# Patient Record
Sex: Female | Born: 1955 | Race: White | Hispanic: No | Smoking: Former smoker
Health system: Southern US, Community
[De-identification: ages and names within clinical notes are randomized; demographics above are authoritative.]

## PROBLEM LIST (undated history)

## (undated) DIAGNOSIS — M1990 Unspecified osteoarthritis, unspecified site: Secondary | ICD-10-CM

## (undated) DIAGNOSIS — E669 Obesity, unspecified: Secondary | ICD-10-CM

## (undated) DIAGNOSIS — E785 Hyperlipidemia, unspecified: Secondary | ICD-10-CM

## (undated) DIAGNOSIS — F32A Depression, unspecified: Secondary | ICD-10-CM

## (undated) DIAGNOSIS — E88819 Insulin resistance, unspecified: Secondary | ICD-10-CM

## (undated) DIAGNOSIS — F909 Attention-deficit hyperactivity disorder, unspecified type: Secondary | ICD-10-CM

## (undated) HISTORY — DX: Obesity, unspecified: E66.9

## (undated) HISTORY — PX: KNEE ARTHROSCOPY: SUR90

## (undated) HISTORY — DX: Depression, unspecified: F32.A

## (undated) HISTORY — PX: COLONOSCOPY: SHX174

## (undated) HISTORY — DX: Attention-deficit hyperactivity disorder, unspecified type: F90.9

## (undated) HISTORY — DX: Insulin resistance, unspecified: E88.819

## (undated) HISTORY — DX: Unspecified osteoarthritis, unspecified site: M19.90

## (undated) HISTORY — DX: Hyperlipidemia, unspecified: E78.5

---

## 1997-11-18 ENCOUNTER — Other Ambulatory Visit: Admission: RE | Admit: 1997-11-18 | Discharge: 1997-11-18 | Payer: Self-pay | Admitting: Obstetrics & Gynecology

## 1998-07-01 ENCOUNTER — Ambulatory Visit (HOSPITAL_COMMUNITY): Admission: RE | Admit: 1998-07-01 | Discharge: 1998-07-01 | Payer: Self-pay | Admitting: Obstetrics & Gynecology

## 1999-02-23 ENCOUNTER — Other Ambulatory Visit: Admission: RE | Admit: 1999-02-23 | Discharge: 1999-02-23 | Payer: Self-pay | Admitting: Obstetrics & Gynecology

## 1999-04-04 ENCOUNTER — Emergency Department (HOSPITAL_COMMUNITY): Admission: EM | Admit: 1999-04-04 | Discharge: 1999-04-04 | Payer: Self-pay | Admitting: Emergency Medicine

## 1999-05-19 ENCOUNTER — Ambulatory Visit (HOSPITAL_COMMUNITY): Admission: RE | Admit: 1999-05-19 | Discharge: 1999-05-19 | Payer: Self-pay | Admitting: Orthopedic Surgery

## 1999-05-19 ENCOUNTER — Encounter: Payer: Self-pay | Admitting: Orthopedic Surgery

## 1999-06-01 ENCOUNTER — Ambulatory Visit (HOSPITAL_COMMUNITY): Admission: RE | Admit: 1999-06-01 | Discharge: 1999-06-01 | Payer: Self-pay | Admitting: Orthopedic Surgery

## 1999-06-01 ENCOUNTER — Encounter: Payer: Self-pay | Admitting: Orthopedic Surgery

## 1999-06-16 ENCOUNTER — Encounter: Payer: Self-pay | Admitting: Orthopedic Surgery

## 1999-06-16 ENCOUNTER — Ambulatory Visit (HOSPITAL_COMMUNITY): Admission: RE | Admit: 1999-06-16 | Discharge: 1999-06-16 | Payer: Self-pay | Admitting: Orthopedic Surgery

## 2000-09-18 ENCOUNTER — Other Ambulatory Visit: Admission: RE | Admit: 2000-09-18 | Discharge: 2000-09-18 | Payer: Self-pay | Admitting: Obstetrics & Gynecology

## 2002-01-30 ENCOUNTER — Other Ambulatory Visit: Admission: RE | Admit: 2002-01-30 | Discharge: 2002-01-30 | Payer: Self-pay | Admitting: Obstetrics & Gynecology

## 2003-02-16 ENCOUNTER — Other Ambulatory Visit: Admission: RE | Admit: 2003-02-16 | Discharge: 2003-02-16 | Payer: Self-pay | Admitting: Obstetrics & Gynecology

## 2004-03-28 ENCOUNTER — Other Ambulatory Visit: Admission: RE | Admit: 2004-03-28 | Discharge: 2004-03-28 | Payer: Self-pay | Admitting: Obstetrics & Gynecology

## 2005-07-24 ENCOUNTER — Other Ambulatory Visit: Admission: RE | Admit: 2005-07-24 | Discharge: 2005-07-24 | Payer: Self-pay | Admitting: Obstetrics & Gynecology

## 2005-08-08 ENCOUNTER — Encounter: Admission: RE | Admit: 2005-08-08 | Discharge: 2005-08-08 | Payer: Self-pay | Admitting: Obstetrics & Gynecology

## 2006-07-24 ENCOUNTER — Encounter: Admission: RE | Admit: 2006-07-24 | Discharge: 2006-07-24 | Payer: Self-pay | Admitting: Obstetrics & Gynecology

## 2013-04-17 ENCOUNTER — Other Ambulatory Visit: Payer: Self-pay | Admitting: Otolaryngology

## 2013-04-17 ENCOUNTER — Ambulatory Visit
Admission: RE | Admit: 2013-04-17 | Discharge: 2013-04-17 | Disposition: A | Discharge status: Home / Self Care | Payer: BC Managed Care – PPO | Source: Ambulatory Visit | Attending: Otolaryngology | Admitting: Otolaryngology

## 2013-04-17 DIAGNOSIS — R1310 Dysphagia, unspecified: Secondary | ICD-10-CM

## 2013-07-14 LAB — HM COLONOSCOPY

## 2013-11-24 ENCOUNTER — Telehealth: Payer: Self-pay | Admitting: *Deleted

## 2013-11-24 NOTE — Telephone Encounter (Signed)
Received referral from Clydie Braun with Dr. Donnetta Hail office requesting for an appt w/ Dr. Welton Flakes.  Called and left a message for Clydie Braun to let her know about Dr. Welton Flakes and request for past history records.

## 2015-03-20 IMAGING — RF DG ESOPHAGUS
11 of 14 series · 19 of 24 positions shown · non-contrast
Comparison: None.

FLUOROSCOPY TIME:  1 min 32nd

CLINICAL DATA: Swallowed a bone last night with chest pain,
evaluate for esophageal injury.

EXAM:
ESOPHOGRAM/BARIUM SWALLOW
TECHNIQUE: Single contrast examination was performed using water-soluble and
thin barium.

[Series 1: run · 1 of 1 slices shown (1 of 11)]
[im 1/1]
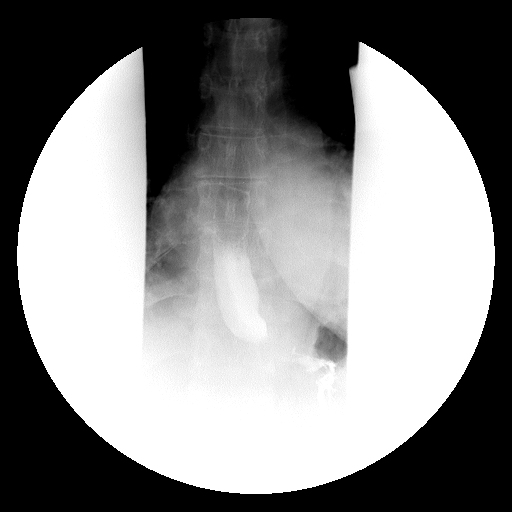

[Series 2: run · 1 of 1 slices shown (2 of 11)]
[im 1/1]
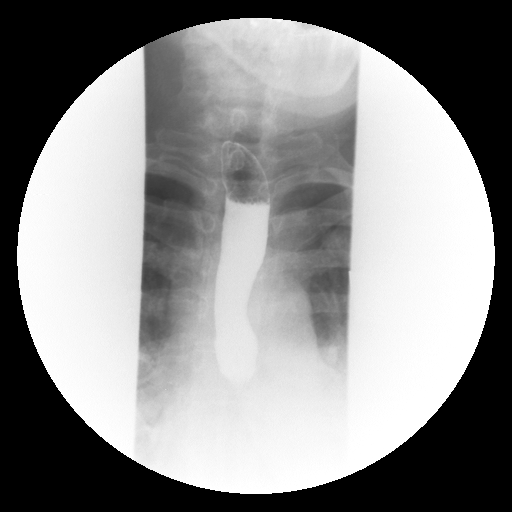

[Series 4: run · 1 of 1 slices shown (3 of 11)]
[im 1/1]
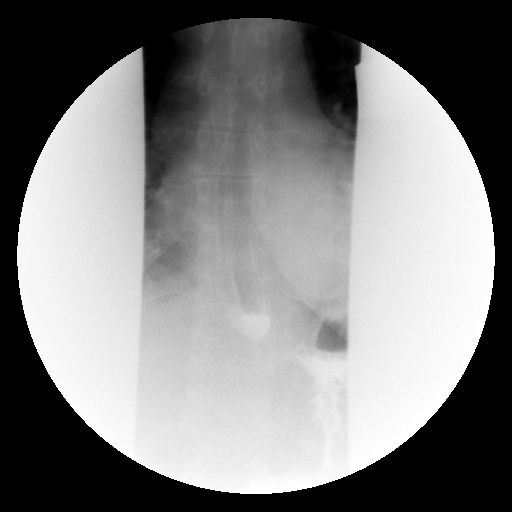

[Series 5: run · 6 of 17 slices shown (4 of 11)]
[im 1/17]
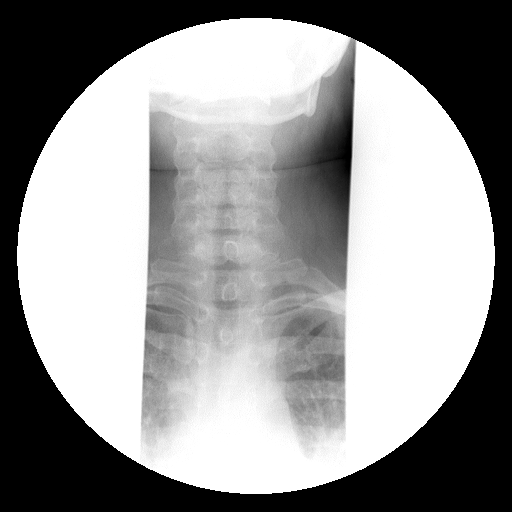
[im 3/17]
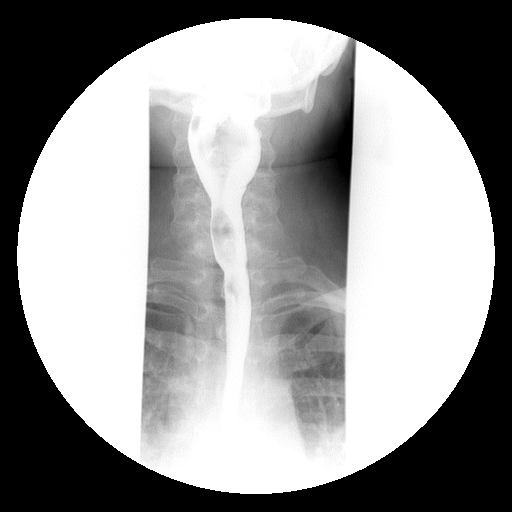
[im 6/17]
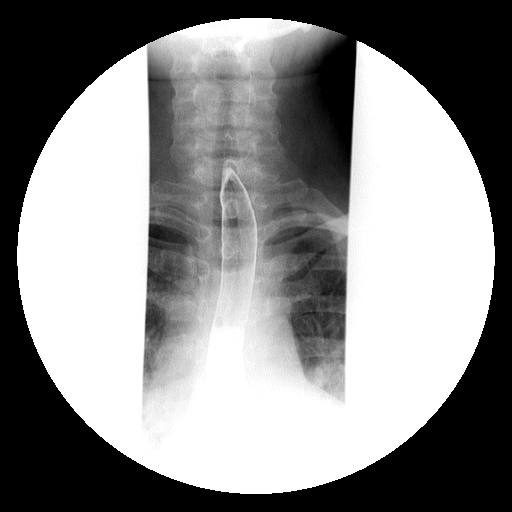
[im 11/17]
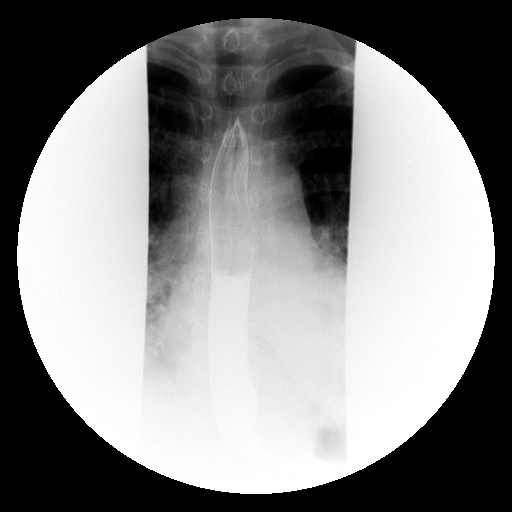
[im 14/17]
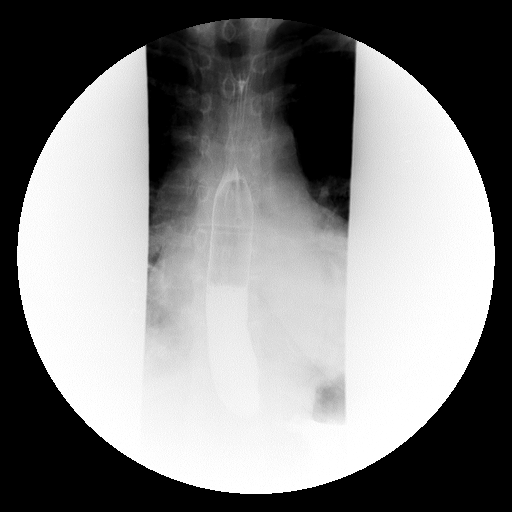
[im 17/17]
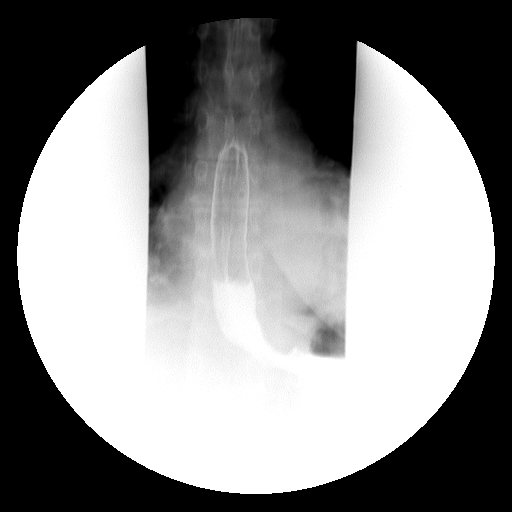

[Series 6: run · 4 of 12 slices shown (5 of 11)]
[im 3/12]
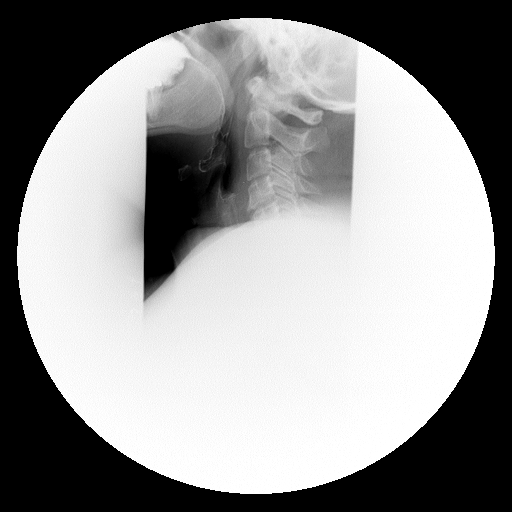
[im 6/12]
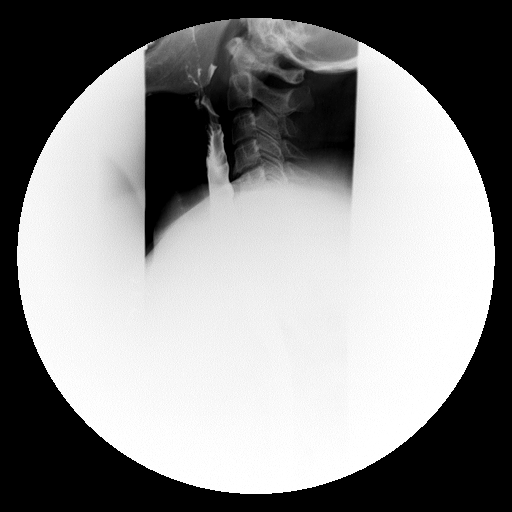
[im 9/12]
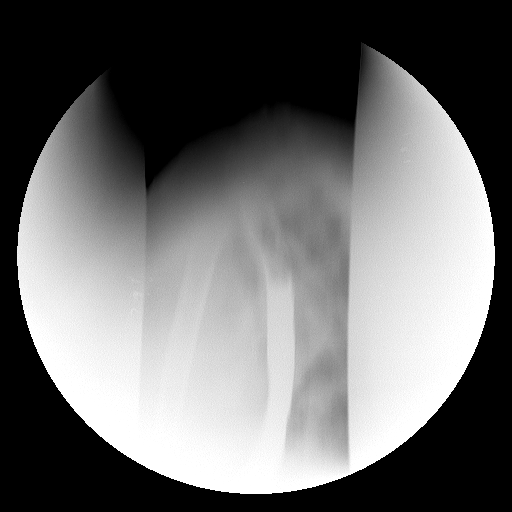
[im 12/12]
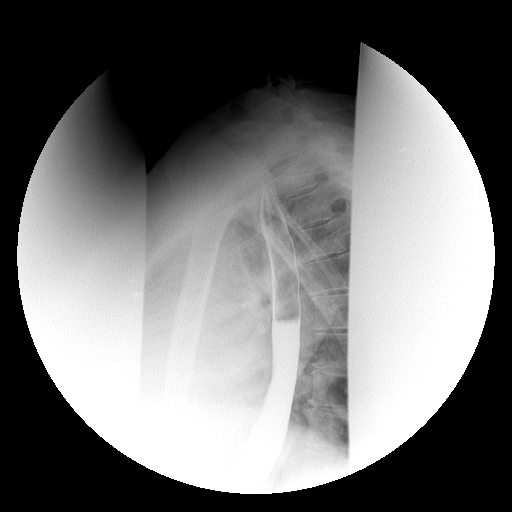

[Series 9: run · 1 of 1 slices shown (6 of 11)]
[im 1/1]
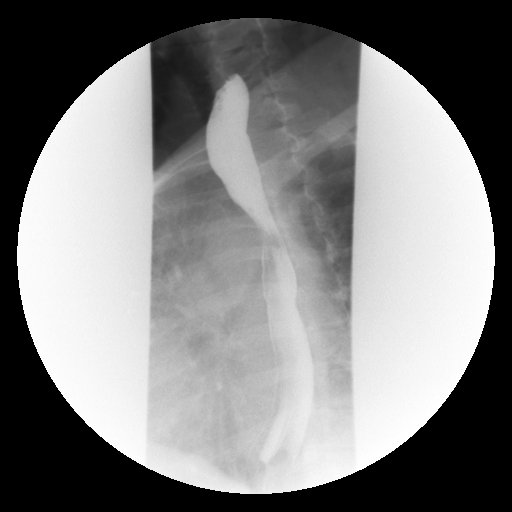

[Series 10: run · 1 of 1 slices shown (7 of 11)]
[im 1/1]
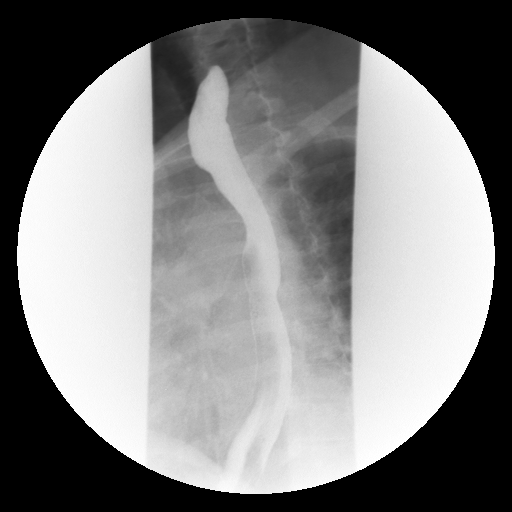

[Series 11: run · 1 of 1 slices shown (8 of 11)]
[im 1/1]
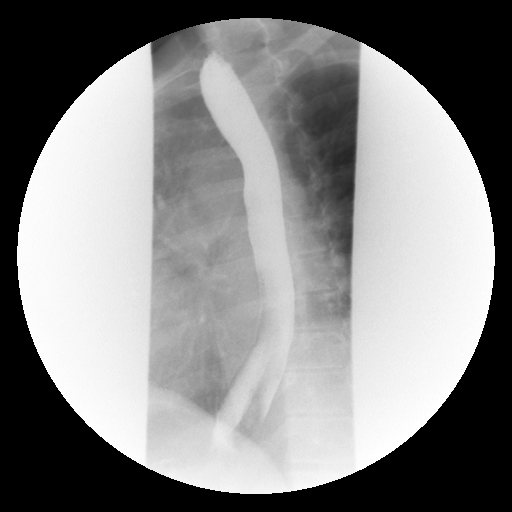

[Series 12: run · 1 of 1 slices shown (9 of 11)]
[im 1/1]
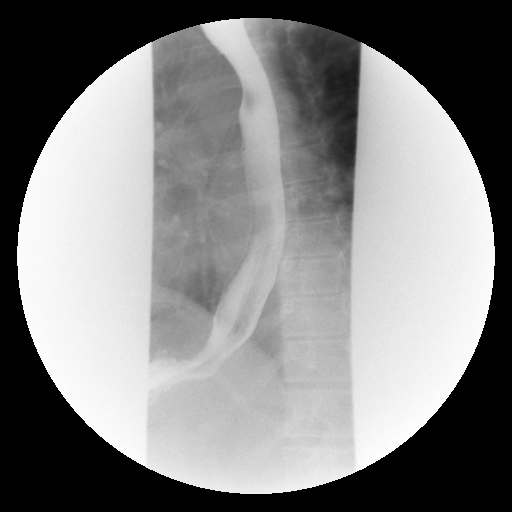

[Series 14: run · 1 of 1 slices shown (10 of 11)]
[im 1/1]
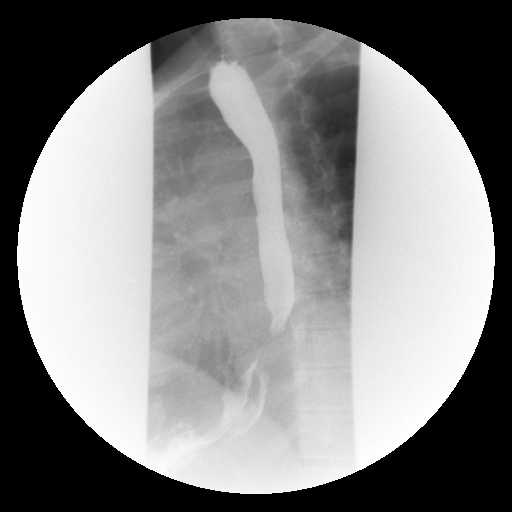

[Series 15: run · 1 of 1 slices shown (11 of 11)]
[im 1/1]
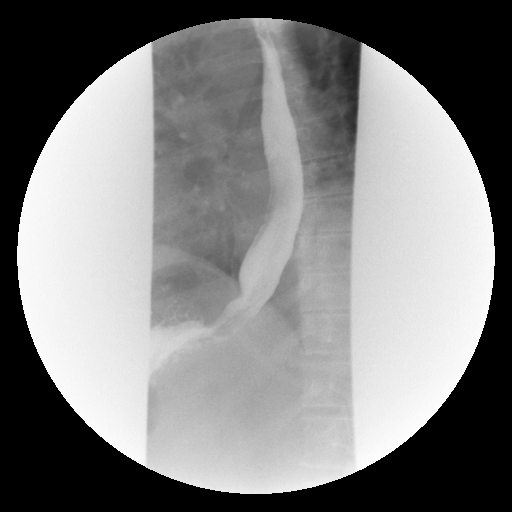

[19 of 24 positions shown; findings below may reference images not displayed]

FINDINGS: Initially a water-soluble study was performed in view of the
patient's history to exclude esophageal perforation. No esophageal
injury is evident. The patient was then given thin barium and rapid
sequence spot films of the entire esophagus were performed showing
no esophageal injury and normal peristaltic motion. No hiatal hernia
is seen. No reflux could be demonstrated.
IMPRESSION: Negative barium swallow. No esophageal perforation is seen. No
reflux could be demonstrated.

## 2018-04-19 ENCOUNTER — Encounter: Payer: Self-pay | Admitting: Emergency Medicine

## 2018-04-19 DIAGNOSIS — F411 Generalized anxiety disorder: Secondary | ICD-10-CM

## 2018-04-19 DIAGNOSIS — F909 Attention-deficit hyperactivity disorder, unspecified type: Secondary | ICD-10-CM | POA: Insufficient documentation

## 2018-05-06 ENCOUNTER — Ambulatory Visit: Payer: Self-pay | Admitting: Psychiatry

## 2018-06-20 ENCOUNTER — Ambulatory Visit: Payer: Self-pay | Admitting: Psychiatry

## 2018-07-12 ENCOUNTER — Telehealth: Payer: Self-pay | Admitting: Psychiatry

## 2018-07-12 NOTE — Telephone Encounter (Signed)
Pt called requesting Adderall to [facility]

## 2018-07-15 ENCOUNTER — Other Ambulatory Visit: Payer: Self-pay

## 2018-07-15 MED ORDER — AMPHETAMINE-DEXTROAMPHETAMINE 30 MG PO TABS: 30.0000 mg | ORAL_TABLET | Freq: Two times a day (BID) | ORAL | 0 refills | Status: DC

## 2018-07-15 NOTE — Telephone Encounter (Signed)
Order pending please send

## 2018-09-09 ENCOUNTER — Ambulatory Visit: Payer: Self-pay | Admitting: Psychiatry

## 2018-09-13 ENCOUNTER — Other Ambulatory Visit: Payer: Self-pay | Admitting: Psychiatry

## 2018-10-07 ENCOUNTER — Other Ambulatory Visit: Payer: Self-pay

## 2018-10-07 ENCOUNTER — Encounter: Payer: Self-pay | Admitting: Psychiatry

## 2018-10-07 ENCOUNTER — Ambulatory Visit (INDEPENDENT_AMBULATORY_CARE_PROVIDER_SITE_OTHER): Payer: Self-pay | Admitting: Psychiatry

## 2018-10-07 DIAGNOSIS — F331 Major depressive disorder, recurrent, moderate: Secondary | ICD-10-CM

## 2018-10-07 DIAGNOSIS — F5081 Binge eating disorder: Secondary | ICD-10-CM

## 2018-10-07 DIAGNOSIS — F902 Attention-deficit hyperactivity disorder, combined type: Secondary | ICD-10-CM

## 2018-10-07 DIAGNOSIS — F411 Generalized anxiety disorder: Secondary | ICD-10-CM

## 2018-10-07 MED ORDER — BUPROPION HCL ER (XL) 150 MG PO TB24: 450.0000 mg | ORAL_TABLET | Freq: Every day | ORAL | 1 refills | Status: DC

## 2018-10-07 MED ORDER — AMPHETAMINE-DEXTROAMPHETAMINE 30 MG PO TABS: 30.0000 mg | ORAL_TABLET | Freq: Two times a day (BID) | ORAL | 0 refills | Status: DC

## 2018-10-07 MED ORDER — BUSPIRONE HCL 10 MG PO TABS: ORAL_TABLET | ORAL | 1 refills | Status: DC

## 2018-10-07 NOTE — Progress Notes (Signed)
Alexa Perez [id number] [birth year] 63 y.o.  Subjective:   Patient ID:  Alexa Perez is a 63 y.o. (DOB 1955/07/13) female.  Chief Complaint:  Chief Complaint  Patient presents with  . Follow-up    Medication Management  . Anxiety    Work related     HPI Alexa Perez presents to the office today for follow-up of ADHD, major depression, generalized anxiety disorder, and binge eating disorder.  Last appt Sept. Weaned Wellbutrin and returned to [place] DT poor response from Wellbutrin for depression and anxiety.  Too expensive and so didn't switch bc it was still too expensive.  Even with the discount card apparently.  Doesn't want med change after all.  Hard to tell how I'm supposed to feel.  Not depressed.  Occ over emotional.  Adderall is fine generally.  Needs it mostly.  When under stress can't tolerate it bc feels to much elevated heart rate.  Both D's in Adderall.    A little anxiety re: work.  Otherwise anxiety is manageable.  Past Psychiatric Medication Trials: Sertraline, Pristiq, Abilify, venlafaxine, Wellbutrin 450, paroxetine, Viibryd, Adderall, Vyvanse  She has been under our care since 2000  Review of Systems:  Review of Systems  Neurological: Negative for tremors and weakness.    Medications: I have reviewed the patient's current medications.  Current Outpatient Medications  Medication Sig Dispense Refill  . albuterol (VENTOLIN HFA) 108 (90 Base) MCG/ACT inhaler 2 PUFF EVERY 4 6 HOURS FOR COUGH/WHEEZES    . amphetamine-dextroamphetamine (ADDERALL) 30 MG tablet Take 1 tablet by mouth 2 (two) times daily. 180 tablet 0  . buPROPion (WELLBUTRIN XL) 150 MG 24 hr tablet Take 3 tablets (450 mg total) by mouth daily. 270 tablet 1  . Chlorphen-PE-Acetaminophen 4-10-325 MG TABS Norel AD 4 mg-10 mg-325 mg tablet  1 TAB EVERY 4-6 HOURS FOR COUGH/CONGESTION    . fluticasone (FLONASE) 50 MCG/ACT nasal spray fluticasone propionate 50 mcg/actuation nasal  spray,suspension  SPRAY 2 SPRAYS INTO EACH NOSTRIL EVERY DAY    . hydrocortisone 2.5 % ointment hydrocortisone 2.5 % topical ointment  1 APPLICATION TO AFFECTED AREA TWICE A DAY EXTERNALLY 14 DAYS    . Ibuprofen-Famotidine (DUEXIS) 800-26.6 MG TABS Duexis 800 mg-26.6 mg tablet  TAKE ONE TABLET BY MOUTH THREE TIMES DAILY    . busPIRone (BUSPAR) 10 MG tablet 1/2 tablet twice daily for 1 week and then 1 twice daily 180 tablet 1   No current facility-administered medications for this visit.     Medication Side Effects: None  Allergies: No Known Allergies  History reviewed. No pertinent past medical history.  History reviewed. No pertinent family history.  Social History   Socioeconomic History  . Marital status: Married    Spouse name: Not on file  . Number of children: Not on file  . Years of education: Not on file  . Highest education level: Not on file  Occupational History  . Not on file  Social Needs  . Financial resource strain: Not on file  . Food insecurity:    Worry: Not on file    Inability: Not on file  . Transportation needs:    Medical: Not on file    Non-medical: Not on file  Tobacco Use  . Smoking status: Former Games developer  . Smokeless tobacco: Never Used  Substance and Sexual Activity  . Alcohol use: Not on file  . Drug use: Not on file  . Sexual activity: Not on file  Lifestyle  . Physical  activity:    Days per week: Not on file    Minutes per session: Not on file  . Stress: Not on file  Relationships  . Social connections:    Talks on phone: Not on file    Gets together: Not on file    Attends religious service: Not on file    Active member of club or organization: Not on file    Attends meetings of clubs or organizations: Not on file    Relationship status: Not on file  . Intimate partner violence:    Fear of current or ex partner: Not on file    Emotionally abused: Not on file    Physically abused: Not on file    Forced sexual activity: Not on  file  Other Topics Concern  . Not on file  Social History Narrative  . Not on file    Past Medical History, Surgical history, Social history, and Family history were reviewed and updated as appropriate.   Please see review of systems for further details on the patient's review from today.   Objective:   Physical Exam:  There were no vitals taken for this visit.  Physical Exam Neurological:     Mental Status: She is alert and oriented to person, place, and time.     Cranial Nerves: No dysarthria.  Psychiatric:        Attention and Perception: Attention normal.        Mood and Affect: Mood is anxious.        Speech: Speech normal.        Behavior: Behavior is cooperative.        Thought Content: Thought content normal. Thought content is not paranoid or delusional. Thought content does not include homicidal or suicidal ideation. Thought content does not include homicidal or suicidal plan.        Cognition and Memory: Cognition and memory normal.        Judgment: Judgment normal.     Comments: Insight good.     Lab Review:  No results found for: NA, K, CL, CO2, GLUCOSE, BUN, CREATININE, CALCIUM, PROT, ALBUMIN, AST, ALT, ALKPHOS, BILITOT, GFRNONAA, GFRAA  No results found for: WBC, RBC, HGB, HCT, PLT, MCV, MCH, MCHC, RDW, LYMPHSABS, MONOABS, EOSABS, BASOSABS  No results found for: POCLITH, LITHIUM   No results found for: PHENYTOIN, PHENOBARB, VALPROATE, CBMZ   .res Assessment: Plan:    Major depressive disorder, recurrent episode, moderate (HCC)  Attention deficit hyperactivity disorder (ADHD), combined type  Generalized anxiety disorder  Binge eating disorder   Serous depression is under reasonable control with the Wellbutrin but not so the anxiety.  She felt better on Viibryd but it is too expensive on her insurance.  She thinks she tried the discount card with it.  In general anxiety responds better to serotonin medications than Wellbutrin.  However she does not  want to try to change the Wellbutrin due to cost reasons and she is tried several other SSRIs and is worried about weight gain.  We will we we will retry the buspirone at low dose 5 mg twice a day for 1 week and then 10 mg twice a day.  She did have some trouble with dizziness in the past but I encouraged her to take it with food and that will lessen the risk.  We can also prescribe more of it at night if need be in order to minimize the dizziness problems.  The ADD is generally managed with Adderall  though she has some trouble tolerating it when she is under stress because she feels too anxious and amped up on it.  We talked about her reducing the dosage during those periods of time.  Follow-up 6 months  I connected with patient by a video enabled telemedicine application or telephone, with their informed consent, and verified patient privacy and that I am speaking with the correct person using two identifiers.  I was located at office and patient at home.  Meredith Staggers, MD, DFAPA   Please see After Visit Summary for patient specific instructions.  No future appointments.  No orders of the defined types were placed in this encounter.     -------------------------------

## 2019-01-15 ENCOUNTER — Other Ambulatory Visit: Payer: Self-pay

## 2019-01-15 ENCOUNTER — Telehealth: Payer: Self-pay | Admitting: Psychiatry

## 2019-01-15 MED ORDER — AMPHETAMINE-DEXTROAMPHETAMINE 30 MG PO TABS: 30.0000 mg | ORAL_TABLET | Freq: Two times a day (BID) | ORAL | 0 refills | Status: DC

## 2019-01-15 MED ORDER — BUPROPION HCL ER (XL) 150 MG PO TB24: 450.0000 mg | ORAL_TABLET | Freq: Every day | ORAL | 1 refills | Status: DC

## 2019-01-15 NOTE — Telephone Encounter (Signed)
Pended for approval.

## 2019-01-15 NOTE — Telephone Encounter (Signed)
Pt needs refill on adderall, bupropion [facility].

## 2019-04-17 ENCOUNTER — Ambulatory Visit (INDEPENDENT_AMBULATORY_CARE_PROVIDER_SITE_OTHER): Payer: 59 | Admitting: Psychiatry

## 2019-04-17 ENCOUNTER — Encounter: Payer: Self-pay | Admitting: Psychiatry

## 2019-04-17 ENCOUNTER — Other Ambulatory Visit: Payer: Self-pay

## 2019-04-17 DIAGNOSIS — F331 Major depressive disorder, recurrent, moderate: Secondary | ICD-10-CM

## 2019-04-17 DIAGNOSIS — F902 Attention-deficit hyperactivity disorder, combined type: Secondary | ICD-10-CM | POA: Diagnosis not present

## 2019-04-17 DIAGNOSIS — F5081 Binge eating disorder: Secondary | ICD-10-CM | POA: Diagnosis not present

## 2019-04-17 DIAGNOSIS — F411 Generalized anxiety disorder: Secondary | ICD-10-CM

## 2019-04-17 MED ORDER — BUSPIRONE HCL 10 MG PO TABS: 10.0000 mg | ORAL_TABLET | Freq: Two times a day (BID) | ORAL | 0 refills | Status: DC

## 2019-04-17 MED ORDER — ARIPIPRAZOLE 5 MG PO TABS: ORAL_TABLET | ORAL | 0 refills | Status: DC

## 2019-04-17 MED ORDER — BUPROPION HCL ER (XL) 150 MG PO TB24
450.0000 mg | ORAL_TABLET | Freq: Every day | ORAL | 1 refills | Status: DC
Start: 2019-04-17 — End: 2019-09-12

## 2019-04-17 MED ORDER — AMPHETAMINE-DEXTROAMPHETAMINE 30 MG PO TABS: 30.0000 mg | ORAL_TABLET | Freq: Two times a day (BID) | ORAL | 0 refills | Status: DC

## 2019-04-17 NOTE — Progress Notes (Signed)
Alexa Perez [id number] 01-Jul-1955 63 y.o.  Subjective:   Patient ID:  Alexa Perez is a 63 y.o. (DOB [birth year]) female.  Chief Complaint:  Chief Complaint  Patient presents with  . Follow-up    Medication Management  . Depression    Medication Management  . ADHD    Medication Management    HPI Alexa Perez presents to the office today for follow-up of ADHD, major depression, generalized anxiety disorder, and binge eating disorder.  Last seen April 2020.  We elected to retry the buspirone at low dose 5 mg twice a day for 1 week and then 10 mg twice a day for anxiety. She continued on Wellbutrin XL 450.  She had previously stopped Viibryd due to cost.  Good overall.  Still working.  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  Buspar seemed to work but taking it off and on for mos. It helps the anxiety.   At times gets out of the habit of taking it.  Trying to take it more consistently now.  Tolerating it without nausea.   Residual depression but Wellbutrin does help.  Is not as good as Viibryd but she could not afford Viibryd.  Adderall is fine generally. Still having ADD sx even with the meds.  Needs it mostly.  She is no longer having elevated heart rate.  She likes the short acting because she can control the time and duration better than with slow release.  Patient denies difficulty with sleep initiation or maintenance. Denies appetite disturbance.  Patient reports that energy and motivation have been good.  Patient has chronic difficulty with concentration.  Patient denies any suicidal ideation.  Both D's in Adderall.    A little anxiety re: work.  Otherwise anxiety is manageable.  Past Psychiatric Medication Trials: Sertraline, Pristiq, Abilify,helped, venlafaxine, Wellbutrin 450, paroxetine, Viibryd too expensive, Adderall, Vyvanse  She has been under our care since 2000  Review of Systems:  Review of Systems  Neurological: Negative for tremors and weakness.   Psychiatric/Behavioral: Positive for depression.    Medications: I have reviewed the patient's current medications.  Current Outpatient Medications  Medication Sig Dispense Refill  . albuterol (VENTOLIN HFA) 108 (90 Base) MCG/ACT inhaler 2 PUFF EVERY 4 6 HOURS FOR COUGH/WHEEZES    . fluticasone (FLONASE) 50 MCG/ACT nasal spray fluticasone propionate 50 mcg/actuation nasal spray,suspension  SPRAY 2 SPRAYS INTO EACH NOSTRIL EVERY DAY    . hydrocortisone 2.5 % ointment hydrocortisone 2.5 % topical ointment  1 APPLICATION TO AFFECTED AREA TWICE A DAY EXTERNALLY 14 DAYS    . Ibuprofen-Famotidine (DUEXIS) 800-26.6 MG TABS Duexis 800 mg-26.6 mg tablet  TAKE ONE TABLET BY MOUTH THREE TIMES DAILY    . meclizine (ANTIVERT) 25 MG tablet meclizine 25 mg tablet    . amphetamine-dextroamphetamine (ADDERALL) 30 MG tablet Take 1 tablet by mouth 2 (two) times daily. 180 tablet 0  . ARIPiprazole (ABILIFY) 5 MG tablet 1/2 tablet in the morning for 1 week then 1 in the morning 90 tablet 0  . buPROPion (WELLBUTRIN XL) 150 MG 24 hr tablet Take 3 tablets (450 mg total) by mouth daily. 270 tablet 1  . busPIRone (BUSPAR) 10 MG tablet Take 1 tablet (10 mg total) by mouth 2 (two) times daily. 180 tablet 0   No current facility-administered medications for this visit.     Medication Side Effects: None  Allergies: No Known Allergies  History reviewed. No pertinent past medical history.  History reviewed. No pertinent  family history.  Social History   Socioeconomic History  . Marital status: Married    Spouse name: Not on file  . Number of children: Not on file  . Years of education: Not on file  . Highest education level: Not on file  Occupational History  . Not on file  Social Needs  . Financial resource strain: Not on file  . Food insecurity    Worry: Not on file    Inability: Not on file  . Transportation needs    Medical: Not on file    Non-medical: Not on file  Tobacco Use  . Smoking  status: Former Research scientist (life sciences)  . Smokeless tobacco: Never Used  Substance and Sexual Activity  . Alcohol use: Not on file  . Drug use: Not on file  . Sexual activity: Not on file  Lifestyle  . Physical activity    Days per week: Not on file    Minutes per session: Not on file  . Stress: Not on file  Relationships  . Social Herbalist on phone: Not on file    Gets together: Not on file    Attends religious service: Not on file    Active member of club or organization: Not on file    Attends meetings of clubs or organizations: Not on file    Relationship status: Not on file  . Intimate partner violence    Fear of current or ex partner: Not on file    Emotionally abused: Not on file    Physically abused: Not on file    Forced sexual activity: Not on file  Other Topics Concern  . Not on file  Social History Narrative  . Not on file    Past Medical History, Surgical history, Social history, and Family history were reviewed and updated as appropriate.   Please see review of systems for further details on the patient's review from today.   Objective:   Physical Exam:  There were no vitals taken for this visit.  Physical Exam Neurological:     Mental Status: She is alert and oriented to person, place, and time.     Cranial Nerves: No dysarthria.  Psychiatric:        Attention and Perception: Attention normal.        Mood and Affect: Mood is anxious and depressed.        Speech: Speech normal.        Behavior: Behavior is cooperative.        Thought Content: Thought content normal. Thought content is not paranoid or delusional. Thought content does not include homicidal or suicidal ideation. Thought content does not include homicidal or suicidal plan.        Cognition and Memory: Cognition and memory normal.        Judgment: Judgment normal.     Comments: Insight good. Mild depression     Lab Review:  No results found for: NA, K, CL, CO2, GLUCOSE, BUN, CREATININE,  CALCIUM, PROT, ALBUMIN, AST, ALT, ALKPHOS, BILITOT, GFRNONAA, GFRAA  No results found for: WBC, RBC, HGB, HCT, PLT, MCV, MCH, MCHC, RDW, LYMPHSABS, MONOABS, EOSABS, BASOSABS  No results found for: POCLITH, LITHIUM   No results found for: PHENYTOIN, PHENOBARB, VALPROATE, CBMZ   .res Assessment: Plan:    Major depressive disorder, recurrent episode, moderate (HCC) - Plan: ARIPiprazole (ABILIFY) 5 MG tablet, buPROPion (WELLBUTRIN XL) 150 MG 24 hr tablet  Attention deficit hyperactivity disorder (ADHD), combined type - Plan: amphetamine-dextroamphetamine (  ADDERALL) 30 MG tablet  Generalized anxiety disorder - Plan: ARIPiprazole (ABILIFY) 5 MG tablet, busPIRone (BUSPAR) 10 MG tablet  Binge eating disorder - Plan: buPROPion (WELLBUTRIN XL) 150 MG 24 hr tablet, amphetamine-dextroamphetamine (ADDERALL) 30 MG tablet   Greater than 50% of 30 min face to face time with patient was spent on counseling and coordination of care. We discussed Serous depression is under fair control with the Wellbutrin.  Not as well as she'd like.  She felt better on Viibryd but it is too expensive on her insurance.  She thinks she tried the discount card with it.   However she does not want to try to change the Wellbutrin due to cost reasons and she is tried several other SSRIs and is worried about weight gain.  We discussed prior benefit with Abilify.  She wants to retry it.  Discussed potential metabolic side effects associated with atypical antipsychotics, as well as potential risk for movement side effects. Advised pt to contact office if movement side effects occur.  Start Abilify 5 mg 1/2 tablet each morning for 1 week and if no benefit increase to  5 smg daily.  Continue the helpful buspirone at low dose 10 mg twice a day.  She did have some trouble with dizziness in the past but not now. take it with food and that will lessen the risk.  If the Abilify helps anxiety she can stop the buspirone.  The ADD is generally  managed with Adderall though she has some trouble tolerating it when she is under stress because she feels too anxious and amped up on it.  We talked about her reducing the dosage during those periods of time.  Follow-up 3 months  Meredith Staggersarey Cottle, MD, DFAPA   Please see After Visit Summary for patient specific instructions.  No future appointments.  No orders of the defined types were placed in this encounter.     -------------------------------

## 2019-07-07 ENCOUNTER — Other Ambulatory Visit: Payer: Self-pay

## 2019-07-07 ENCOUNTER — Telehealth: Payer: Self-pay | Admitting: Psychiatry

## 2019-07-07 DIAGNOSIS — F902 Attention-deficit hyperactivity disorder, combined type: Secondary | ICD-10-CM

## 2019-07-07 DIAGNOSIS — F5081 Binge eating disorder: Secondary | ICD-10-CM

## 2019-07-07 MED ORDER — AMPHETAMINE-DEXTROAMPHETAMINE 30 MG PO TABS: 30.0000 mg | ORAL_TABLET | Freq: Two times a day (BID) | ORAL | 0 refills | Status: DC

## 2019-07-07 NOTE — Telephone Encounter (Signed)
Pt is requesting her Wellbutrin be sent as a 3 mos supply instead of monthly on her next refill. She is requesting an Adderall refill today from [facility]. Last appt 10/29 no f/u scheduled.

## 2019-07-07 NOTE — Telephone Encounter (Signed)
Last refill 04/22/2019 for #180 90 day supply Pended Adderall to be approved by Dr. Jennelle Human  Patient's Wellbutrin is submitted to [facility] as 90 day

## 2019-08-12 ENCOUNTER — Telehealth: Payer: Self-pay | Admitting: Psychiatry

## 2019-08-12 NOTE — Telephone Encounter (Signed)
Pt called to report she has had covid for 2 weeks and has not been taking meds due to covid and sleeping thru and not remembering. She is now having a lot of depression and panic attacks. Ask you to call in for this. [phone]

## 2019-08-12 NOTE — Telephone Encounter (Signed)
Tried to reach patient but no answer and her mailbox is full. Will try again in the morning.

## 2019-08-12 NOTE — Telephone Encounter (Signed)
Restart the Abilify at the normal dose she was taking before.  Restart Wellbutrin at 300 mg daily for 5 days then 450 mg that she had previously taken.  Do not restart the Adderall until the anxiety is better.  She cannot routinely take stimulants with benzodiazepines but I could give her a week supply of Ativan or Xanax if necessary to manage the anxiety until the Abilify starts to work.

## 2019-08-13 NOTE — Telephone Encounter (Signed)
Left message with detailed information and to call back

## 2019-08-19 NOTE — Telephone Encounter (Signed)
Left patient message to follow up on how she's feeling this week and make sure she received my message.

## 2019-09-08 NOTE — Telephone Encounter (Signed)
Admin staff inform patient needs appointment

## 2019-09-12 ENCOUNTER — Ambulatory Visit (INDEPENDENT_AMBULATORY_CARE_PROVIDER_SITE_OTHER): Payer: 59 | Admitting: Psychiatry

## 2019-09-12 ENCOUNTER — Encounter: Payer: Self-pay | Admitting: Psychiatry

## 2019-09-12 DIAGNOSIS — F5081 Binge eating disorder: Secondary | ICD-10-CM | POA: Diagnosis not present

## 2019-09-12 DIAGNOSIS — F411 Generalized anxiety disorder: Secondary | ICD-10-CM

## 2019-09-12 DIAGNOSIS — F331 Major depressive disorder, recurrent, moderate: Secondary | ICD-10-CM | POA: Diagnosis not present

## 2019-09-12 DIAGNOSIS — F902 Attention-deficit hyperactivity disorder, combined type: Secondary | ICD-10-CM

## 2019-09-12 DIAGNOSIS — F50819 Binge eating disorder, unspecified: Secondary | ICD-10-CM

## 2019-09-12 MED ORDER — BUPROPION HCL ER (XL) 150 MG PO TB24: 450.0000 mg | ORAL_TABLET | Freq: Every day | ORAL | 0 refills | Status: DC

## 2019-09-12 MED ORDER — AMPHETAMINE-DEXTROAMPHETAMINE 30 MG PO TABS: 30.0000 mg | ORAL_TABLET | Freq: Two times a day (BID) | ORAL | 0 refills | Status: DC

## 2019-09-12 NOTE — Progress Notes (Signed)
Alexa Perez [id number] [birth year] 64 y.o.  Subjective:   Patient ID:  Alexa Perez is a 64 y.o. (DOB 1956/01/07) female.  Chief Complaint:  Chief Complaint  Patient presents with  . Follow-up  . Depression  . Anxiety  . ADHD    Depression        Alexa Perez presents to the office today for follow-up of ADHD, major depression, generalized anxiety disorder, and binge eating disorder.  seen April 2020.  We elected to retry the buspirone at low dose 5 mg twice a day for 1 week and then 10 mg twice a day for anxiety. She continued on Wellbutrin XL 450.  She had previously stopped Viibryd due to cost.  Last seen October 2020.  She had residual depression and it was suggested she return to Abilify which is previously been helpful.  She was to follow-up in 3 months but it has been about 6 months.  Had massive anxiety with Panic with Covid early Feb and felt she couldn't breathe.Had not taken anything during Covid and slept a lot.  Restarted antidepressant after Covid.  Missing some.  Still very lethargic and SOB related to Covid.  Intermittent loss of taste.  Never took Abilify consistently since here.  Was taking Wellbutrin consistently not Buspar before Covid.  Pre-Covid had still been unmotivated with low energy and productivity.  Adderall would work for that.  Adderall compliance also varies. Enough sleep. Anhedonia not sad. Depression 5/10.  Anxiety  5/10.    Adderall is fine generally. Still having ADD sx even with the meds.  Needs it mostly.  She is no longer having elevated heart rate.  She likes the short acting because she can control the time and duration better than with slow release.  Patient denies difficulty with sleep initiation or maintenance. Denies appetite disturbance.    Patient has chronic difficulty with concentration.  Patient denies any suicidal ideation.  Both D's on Adderall.    A little anxiety re: work.  Otherwise anxiety is manageable.  Past  Psychiatric Medication Trials: Sertraline, Pristiq, venlafaxine, Wellbutrin 450, paroxetine, Viibryd helped too expensive,  Abilify,helped,   Adderall, Vyvanse didn't like  She has been under our care since 2000  Review of Systems:  Review of Systems  Constitutional: Positive for unexpected weight change.  Neurological: Negative for tremors and weakness.  Psychiatric/Behavioral: Positive for depression.    Medications: I have reviewed the patient's current medications.  Current Outpatient Medications  Medication Sig Dispense Refill  . albuterol (VENTOLIN HFA) 108 (90 Base) MCG/ACT inhaler 2 PUFF EVERY 4 6 HOURS FOR COUGH/WHEEZES    . amphetamine-dextroamphetamine (ADDERALL) 30 MG tablet Take 1 tablet by mouth 2 (two) times daily. 180 tablet 0  . buPROPion (WELLBUTRIN XL) 150 MG 24 hr tablet Take 3 tablets (450 mg total) by mouth daily. 270 tablet 1  . fluticasone (FLONASE) 50 MCG/ACT nasal spray fluticasone propionate 50 mcg/actuation nasal spray,suspension  SPRAY 2 SPRAYS INTO EACH NOSTRIL EVERY DAY    . hydrocortisone 2.5 % ointment hydrocortisone 2.5 % topical ointment  1 APPLICATION TO AFFECTED AREA TWICE A DAY EXTERNALLY 14 DAYS    . Ibuprofen-Famotidine (DUEXIS) 800-26.6 MG TABS Duexis 800 mg-26.6 mg tablet  TAKE ONE TABLET BY MOUTH THREE TIMES DAILY    . meclizine (ANTIVERT) 25 MG tablet meclizine 25 mg tablet    . ARIPiprazole (ABILIFY) 5 MG tablet 1/2 tablet in the morning for 1 week then 1 in the morning (Patient not taking: Reported  on 09/12/2019) 90 tablet 0  . busPIRone (BUSPAR) 10 MG tablet Take 1 tablet (10 mg total) by mouth 2 (two) times daily. (Patient not taking: Reported on 09/12/2019) 180 tablet 0   No current facility-administered medications for this visit.    Medication Side Effects: None  Allergies: No Known Allergies  History reviewed. No pertinent past medical history.  History reviewed. No pertinent family history.  Social History   Socioeconomic  History  . Marital status: Married    Spouse name: Not on file  . Number of children: Not on file  . Years of education: Not on file  . Highest education level: Not on file  Occupational History  . Not on file  Tobacco Use  . Smoking status: Former Research scientist (life sciences)  . Smokeless tobacco: Never Used  Substance and Sexual Activity  . Alcohol use: Not on file  . Drug use: Not on file  . Sexual activity: Not on file  Other Topics Concern  . Not on file  Social History Narrative  . Not on file   Social Determinants of Health   Financial Resource Strain:   . Difficulty of Paying Living Expenses:   Food Insecurity:   . Worried About Charity fundraiser in the Last Year:   . Arboriculturist in the Last Year:   Transportation Needs:   . Film/video editor (Medical):   Marland Kitchen Lack of Transportation (Non-Medical):   Physical Activity:   . Days of Exercise per Week:   . Minutes of Exercise per Session:   Stress:   . Feeling of Stress :   Social Connections:   . Frequency of Communication with Friends and Family:   . Frequency of Social Gatherings with Friends and Family:   . Attends Religious Services:   . Active Member of Clubs or Organizations:   . Attends Archivist Meetings:   Marland Kitchen Marital Status:   Intimate Partner Violence:   . Fear of Current or Ex-Partner:   . Emotionally Abused:   Marland Kitchen Physically Abused:   . Sexually Abused:     Past Medical History, Surgical history, Social history, and Family history were reviewed and updated as appropriate.   Please see review of systems for further details on the patient's review from today.   Objective:   Physical Exam:  There were no vitals taken for this visit.  Physical Exam Neurological:     Mental Status: She is alert and oriented to person, place, and time.     Cranial Nerves: No dysarthria.  Psychiatric:        Attention and Perception: Attention normal.        Mood and Affect: Mood is anxious and depressed.         Speech: Speech normal.        Behavior: Behavior is cooperative.        Thought Content: Thought content normal. Thought content is not paranoid or delusional. Thought content does not include homicidal or suicidal ideation. Thought content does not include homicidal or suicidal plan.        Cognition and Memory: Cognition and memory normal.        Judgment: Judgment normal.     Comments: Insight good. Anhedonic poor peripherally poorly productive depression     Lab Review:  No results found for: NA, K, CL, CO2, GLUCOSE, BUN, CREATININE, CALCIUM, PROT, ALBUMIN, AST, ALT, ALKPHOS, BILITOT, GFRNONAA, GFRAA  No results found for: WBC, RBC, HGB, HCT, PLT, MCV,  MCH, MCHC, RDW, LYMPHSABS, MONOABS, EOSABS, BASOSABS  No results found for: POCLITH, LITHIUM   No results found for: PHENYTOIN, PHENOBARB, VALPROATE, CBMZ   .res Assessment: Plan:    Major depressive disorder, recurrent episode, moderate (HCC)  Generalized anxiety disorder  Attention deficit hyperactivity disorder (ADHD), combined type  Binge eating disorder   Greater than 50% of 30 min face to face time with patient was spent on counseling and coordination of care. We discussed Serous depression is under fair control with the Wellbutrin.  Not as well as she'd like.  But she has not been consistent with it as much as she should be.  She is not taking Abilify at all and is not taking buspirone at all.  She felt better on Viibryd but it was too expensive on her insurance.  Week could consider the Viibryd because she has had a change of insurance and it might be covered now.  The other option would be Trintellix which is typically good for depression with anhedonia and has low weight gain risks.  Instead we will stick with Wellbutrin and retry Abilify and if that does not work then consider the other alternatives.  We discussed prior benefit with Abilify.  She wants to retry it.  Discussed potential metabolic side effects associated  with atypical antipsychotics, as well as potential risk for movement side effects. Advised pt to contact office if movement side effects occur.  Start Abilify 5 mg 1/2 tablet each morning for 1 week and if no benefit increase to  5 smg daily.  Stop buspirone dT polypharmacy and compliance problems.  Be more consistent with Wellbutrin otherwise will not work.  The ADD is generally managed with Adderall though she has some trouble tolerating it when she is under stress because she feels too anxious and amped up on it.  We talked about her reducing the dosage during those periods of time.  Follow-up 2 months  Meredith Staggers, MD, DFAPA   Please see After Visit Summary for patient specific instructions.  No future appointments.  No orders of the defined types were placed in this encounter.     -------------------------------

## 2019-11-07 ENCOUNTER — Ambulatory Visit (INDEPENDENT_AMBULATORY_CARE_PROVIDER_SITE_OTHER): Payer: 59 | Admitting: Psychiatry

## 2019-11-07 ENCOUNTER — Encounter: Payer: Self-pay | Admitting: Psychiatry

## 2019-11-07 DIAGNOSIS — F902 Attention-deficit hyperactivity disorder, combined type: Secondary | ICD-10-CM

## 2019-11-07 DIAGNOSIS — F411 Generalized anxiety disorder: Secondary | ICD-10-CM

## 2019-11-07 DIAGNOSIS — F5081 Binge eating disorder: Secondary | ICD-10-CM

## 2019-11-07 DIAGNOSIS — F331 Major depressive disorder, recurrent, moderate: Secondary | ICD-10-CM

## 2019-11-07 MED ORDER — BUPROPION HCL ER (XL) 150 MG PO TB24: 450.0000 mg | ORAL_TABLET | Freq: Every day | ORAL | 1 refills | Status: DC

## 2019-11-07 MED ORDER — ARIPIPRAZOLE 5 MG PO TABS: 5.0000 mg | ORAL_TABLET | Freq: Every day | ORAL | 1 refills | Status: DC

## 2019-11-07 MED ORDER — AMPHETAMINE-DEXTROAMPHETAMINE 30 MG PO TABS: 30.0000 mg | ORAL_TABLET | Freq: Two times a day (BID) | ORAL | 0 refills | Status: DC

## 2019-11-07 NOTE — Progress Notes (Signed)
GUINEVERE STEPHENSON [id number] 1955-12-19 64 y.o.   I connected with  WHITLEIGH GARRAMONE on 11/07/19 by a video enabled telemedicine application and verified that I am speaking with the correct person using two identifiers.   I discussed the limitations of evaluation and management by telemedicine. The patient expressed understanding and agreed to proceed.   Subjective:   Patient ID:  TENE GATO is a 64 y.o. (DOB 01/16/1956) female.  Chief Complaint:  Chief Complaint  Patient presents with  . Follow-up  . Depression  . Anxiety    Depression        TKAI LARGE presents to the office today for follow-up of ADHD, major depression, generalized anxiety disorder, and binge eating disorder.  seen April 2020.  We elected to retry the buspirone at low dose 5 mg twice a day for 1 week and then 10 mg twice a day for anxiety. She continued on Wellbutrin XL 450.  She had previously stopped Viibryd due to cost.  Last seen October 2020.  She had residual depression and it was suggested she return to Abilify which is previously been helpful.  She was to follow-up in 3 months but it has been about 6 months.  Last seen 09/12/19 with the followining noted: Had massive anxiety with Panic with Covid early Feb and felt she couldn't breathe.Had not taken anything during Covid and slept a lot.  Restarted antidepressant after Covid.  Missing some.  Still very lethargic and SOB related to Covid.  Intermittent loss of taste. Never took Abilify consistently since here.  Was taking Wellbutrin consistently not Buspar before Covid.  Pre-Covid had still been unmotivated with low energy and productivity.  Adderall would work for that.  Adderall compliance also varies. Enough sleep. Anhedonia not sad. Depression 5/10.  Anxiety  5/10.   Plan: Start Abilify 5 mg 1/2 tablet each morning for 1 week and if no benefit increase to  5 smg daily. Stop buspirone dT polypharmacy and compliance problems. Be more consistent  with Wellbutrin otherwise will not work.  11/07/2019 appointment the following is noted: Abilify helped.  Depression started going away.  Anxiety better and handling things better and more assertive.  Doesn't want a change.  Tolerating it well. Sleep plenty.  Awakening on time generally. Adderall first thing in AM and helped to take it in a different pattern. Adderall is fine generally. Still having ADD sx even with the meds.  Needs it mostly.  She is no longer having elevated heart rate.  She likes the short acting because she can control the time and duration better than with slow release.  Patient denies difficulty with sleep initiation or maintenance. Denies appetite disturbance.    Patient has chronic difficulty with concentration.  Patient denies any suicidal ideation.  Both D's on Adderall.    A little anxiety re: work.  Otherwise anxiety is manageable.  Past Psychiatric Medication Trials: Sertraline, Pristiq, venlafaxine, Wellbutrin 450, paroxetine, Viibryd helped too expensive,  Abilify,helped,   Adderall, Vyvanse didn't like  She has been under our care since 2000  Review of Systems:  Review of Systems  Constitutional: Negative for unexpected weight change.  Neurological: Negative for tremors and weakness.  Psychiatric/Behavioral: Positive for depression.    Medications: I have reviewed the patient's current medications.  Current Outpatient Medications  Medication Sig Dispense Refill  . albuterol (VENTOLIN HFA) 108 (90 Base) MCG/ACT inhaler 2 PUFF EVERY 4 6 HOURS FOR COUGH/WHEEZES    . ARIPiprazole (ABILIFY) 5 MG  tablet Take 1 tablet (5 mg total) by mouth daily. 90 tablet 1  . buPROPion (WELLBUTRIN XL) 150 MG 24 hr tablet Take 3 tablets (450 mg total) by mouth daily. 270 tablet 1  . fluticasone (FLONASE) 50 MCG/ACT nasal spray fluticasone propionate 50 mcg/actuation nasal spray,suspension  SPRAY 2 SPRAYS INTO EACH NOSTRIL EVERY DAY    . hydrocortisone 2.5 % ointment  hydrocortisone 2.5 % topical ointment  1 APPLICATION TO AFFECTED AREA TWICE A DAY EXTERNALLY 14 DAYS    . Ibuprofen-Famotidine (DUEXIS) 800-26.6 MG TABS Duexis 800 mg-26.6 mg tablet  TAKE ONE TABLET BY MOUTH THREE TIMES DAILY    . meclizine (ANTIVERT) 25 MG tablet meclizine 25 mg tablet    . amphetamine-dextroamphetamine (ADDERALL) 30 MG tablet Take 1 tablet by mouth 2 (two) times daily. 180 tablet 0   No current facility-administered medications for this visit.    Medication Side Effects: None  Allergies: No Known Allergies  History reviewed. No pertinent past medical history.  History reviewed. No pertinent family history.  Social History   Socioeconomic History  . Marital status: Married    Spouse name: Not on file  . Number of children: Not on file  . Years of education: Not on file  . Highest education level: Not on file  Occupational History  . Not on file  Tobacco Use  . Smoking status: Former Games developer  . Smokeless tobacco: Never Used  Substance and Sexual Activity  . Alcohol use: Not on file  . Drug use: Not on file  . Sexual activity: Not on file  Other Topics Concern  . Not on file  Social History Narrative  . Not on file   Social Determinants of Health   Financial Resource Strain:   . Difficulty of Paying Living Expenses:   Food Insecurity:   . Worried About Programme researcher, broadcasting/film/video in the Last Year:   . Barista in the Last Year:   Transportation Needs:   . Freight forwarder (Medical):   Marland Kitchen Lack of Transportation (Non-Medical):   Physical Activity:   . Days of Exercise per Week:   . Minutes of Exercise per Session:   Stress:   . Feeling of Stress :   Social Connections:   . Frequency of Communication with Friends and Family:   . Frequency of Social Gatherings with Friends and Family:   . Attends Religious Services:   . Active Member of Clubs or Organizations:   . Attends Banker Meetings:   Marland Kitchen Marital Status:   Intimate Partner  Violence:   . Fear of Current or Ex-Partner:   . Emotionally Abused:   Marland Kitchen Physically Abused:   . Sexually Abused:     Past Medical History, Surgical history, Social history, and Family history were reviewed and updated as appropriate.   Please see review of systems for further details on the patient's review from today.   Objective:   Physical Exam:  There were no vitals taken for this visit.  Physical Exam Neurological:     Mental Status: She is alert and oriented to person, place, and time.     Cranial Nerves: No dysarthria.  Psychiatric:        Attention and Perception: Attention normal.        Mood and Affect: Mood is not anxious or depressed.        Speech: Speech normal.        Behavior: Behavior is cooperative.  Thought Content: Thought content normal. Thought content is not paranoid or delusional. Thought content does not include homicidal or suicidal ideation. Thought content does not include homicidal or suicidal plan.        Cognition and Memory: Cognition and memory normal.        Judgment: Judgment normal.     Comments: Insight good. Much better dep and anxiety     Lab Review:  No results found for: NA, K, CL, CO2, GLUCOSE, BUN, CREATININE, CALCIUM, PROT, ALBUMIN, AST, ALT, ALKPHOS, BILITOT, GFRNONAA, GFRAA  No results found for: WBC, RBC, HGB, HCT, PLT, MCV, MCH, MCHC, RDW, LYMPHSABS, MONOABS, EOSABS, BASOSABS  No results found for: POCLITH, LITHIUM   No results found for: PHENYTOIN, PHENOBARB, VALPROATE, CBMZ   .res Assessment: Plan:    Major depressive disorder, recurrent episode, moderate (HCC) - Plan: ARIPiprazole (ABILIFY) 5 MG tablet, buPROPion (WELLBUTRIN XL) 150 MG 24 hr tablet  Generalized anxiety disorder - Plan: ARIPiprazole (ABILIFY) 5 MG tablet  Attention deficit hyperactivity disorder (ADHD), combined type - Plan: amphetamine-dextroamphetamine (ADDERALL) 30 MG tablet  Binge eating disorder - Plan: buPROPion (WELLBUTRIN XL) 150 MG 24  hr tablet, amphetamine-dextroamphetamine (ADDERALL) 30 MG tablet   At the last appointment she was struggling with moderate depression and anxiety but also had recent panic attacks.  The Adderall was inconsistent.  We discussed various options and decided to potentiate Wellbutrin with the Abilify which has been very successful and she is tolerated it well.  Her anxiety and depression are under control now without recent panic.  Her ADD is Beg better with good schedule of dosing.  Discussed potential metabolic side effects associated with atypical antipsychotics, as well as potential risk for movement side effects. Advised pt to contact office if movement side effects occur.  Abilify 5 mg daily was markedly helpful. Continue Wellbutrin XL 450 mg every morning Continue Adderall 30 mg twice daily.  Follow-up 4 months  Lynder Parents, MD, DFAPA   Please see After Visit Summary for patient specific instructions.  No future appointments.  No orders of the defined types were placed in this encounter.     -------------------------------

## 2019-12-01 ENCOUNTER — Telehealth: Payer: Self-pay | Admitting: Psychiatry

## 2019-12-01 NOTE — Telephone Encounter (Signed)
Pt would like to talk about adding a medication for anxiety that will go along with her other meds. Please call.

## 2019-12-02 NOTE — Telephone Encounter (Signed)
She is on Wellbutrin, Adderall, and Abilify 5 mg was last med added which helped her depression.  She has taken buspirone in the past but had difficulty with compliance because of twice daily dosing.  She wants something for anxiety.  We could consider starting an SSRI but historically she has wanted to avoid those.  We want to avoid benzodiazepines because of taking the Adderall.  Therefore the simplest solution would be an increase in Abilify.  Have her increase Abilify to 1-1/2 of the 5 mg tablets or 7.5 mg daily.  We can go as high as 10 mg if needed.  She should see improvement within a couple of weeks.

## 2019-12-02 NOTE — Telephone Encounter (Signed)
LM with detailed information and to call back to discuss.

## 2020-03-09 ENCOUNTER — Telehealth: Payer: Self-pay | Admitting: Psychiatry

## 2020-03-09 NOTE — Telephone Encounter (Signed)
Patient's next appt is 11/9 and needs refill on Adderall for a 90 day supply.  Call to [facility].

## 2020-03-10 ENCOUNTER — Other Ambulatory Visit: Payer: Self-pay

## 2020-03-10 DIAGNOSIS — F902 Attention-deficit hyperactivity disorder, combined type: Secondary | ICD-10-CM

## 2020-03-10 DIAGNOSIS — F5081 Binge eating disorder: Secondary | ICD-10-CM

## 2020-03-10 MED ORDER — AMPHETAMINE-DEXTROAMPHETAMINE 30 MG PO TABS: 30.0000 mg | ORAL_TABLET | Freq: Two times a day (BID) | ORAL | 0 refills | Status: DC

## 2020-03-10 NOTE — Telephone Encounter (Signed)
Last refill 12/26/2019 Pended for 90 day supply, due back 03/25/2020

## 2020-04-27 ENCOUNTER — Telehealth (INDEPENDENT_AMBULATORY_CARE_PROVIDER_SITE_OTHER): Payer: 59 | Admitting: Psychiatry

## 2020-04-27 ENCOUNTER — Encounter: Payer: Self-pay | Admitting: Psychiatry

## 2020-04-27 DIAGNOSIS — F5081 Binge eating disorder: Secondary | ICD-10-CM

## 2020-04-27 DIAGNOSIS — F331 Major depressive disorder, recurrent, moderate: Secondary | ICD-10-CM

## 2020-04-27 DIAGNOSIS — F902 Attention-deficit hyperactivity disorder, combined type: Secondary | ICD-10-CM

## 2020-04-27 DIAGNOSIS — F411 Generalized anxiety disorder: Secondary | ICD-10-CM

## 2020-04-27 DIAGNOSIS — F3341 Major depressive disorder, recurrent, in partial remission: Secondary | ICD-10-CM

## 2020-04-27 MED ORDER — AMPHETAMINE-DEXTROAMPHETAMINE 30 MG PO TABS: 30.0000 mg | ORAL_TABLET | Freq: Two times a day (BID) | ORAL | 0 refills | Status: DC

## 2020-04-27 MED ORDER — ARIPIPRAZOLE 10 MG PO TABS: 10.0000 mg | ORAL_TABLET | Freq: Every day | ORAL | 1 refills | Status: DC

## 2020-04-27 MED ORDER — BUPROPION HCL ER (XL) 150 MG PO TB24: 450.0000 mg | ORAL_TABLET | Freq: Every day | ORAL | 1 refills | Status: DC

## 2020-04-27 NOTE — Progress Notes (Signed)
Alexa Perez [id number] [birth year] 64 y.o.   Video Visit via My Chart  I connected with pt by My Chart and verified that I am speaking with the correct person using two identifiers.   I discussed the limitations, risks, security and privacy concerns of performing an evaluation and management service by My Chart  and the availability of in person appointments. I also discussed with the patient that there may be a patient responsible charge related to this service. The patient expressed understanding and agreed to proceed.  I discussed the assessment and treatment plan with the patient. The patient was provided an opportunity to ask questions and all were answered. The patient agreed with the plan and demonstrated an understanding of the instructions.   The patient was advised to call back or seek an in-person evaluation if the symptoms worsen or if the condition fails to improve as anticipated.  I provided 30 minutes of video time during this encounter.  The patient was located at home and the provider was located office. started at 10 and ended at 1030  Subjective:   Patient ID:  Alexa Perez is a 64 y.o. (DOB 27-Aug-1955) female.  Chief Complaint:  Chief Complaint  Patient presents with  . Follow-up  . Anxiety  . Depression  . ADHD    Depression        Associated symptoms include no fatigue.  Alexa Perez presents to the office today for follow-up of ADHD, major depression, generalized anxiety disorder, and binge eating disorder.  seen April 2020.  We elected to retry the buspirone at low dose 5 mg twice a day for 1 week and then 10 mg twice a day for anxiety. She continued on Wellbutrin XL 450.  She had previously stopped Viibryd due to cost.  Last seen October 2020.  She had residual depression and it was suggested she return to Abilify which is previously been helpful.  She was to follow-up in 3 months but it has been about 6 months.  Last seen 09/12/19 with the  followining noted: Had massive anxiety with Panic with Covid early Feb and felt she couldn't breathe.Had not taken anything during Covid and slept a lot.  Restarted antidepressant after Covid.  Missing some.  Still very lethargic and SOB related to Covid.  Intermittent loss of taste. Never took Abilify consistently since here.  Was taking Wellbutrin consistently not Buspar before Covid.  Pre-Covid had still been unmotivated with low energy and productivity.  Adderall would work for that.  Adderall compliance also varies. Enough sleep. Anhedonia not sad. Depression 5/10.  Anxiety  5/10.   Plan: Start Abilify 5 mg 1/2 tablet each morning for 1 week and if no benefit increase to  5 smg daily. Stop buspirone dT polypharmacy and compliance problems. Be more consistent with Wellbutrin otherwise will not work.  11/07/2019 appointment the following is noted: Abilify helped.  Depression started going away.  Anxiety better and handling things better and more assertive.  Doesn't want a change.  Tolerating it well. Sleep plenty.  Awakening on time generally. Adderall first thing in AM and helped to take it in a different pattern. Adderall is fine generally. Still having ADD sx even with the meds.  Needs it mostly.  She is no longer having elevated heart rate.  She likes the short acting because she can control the time and duration better than with slow release. Plan: No med changes  12/01/2019 phone call from patient with the following noted:  She is on Wellbutrin, Adderall, and Abilify 5 mg was last med added which helped her depression.  She has taken buspirone in the past but had difficulty with compliance because of twice daily dosing. She wants something for anxiety.  We could consider starting an SSRI but historically she has wanted to avoid those.  We want to avoid benzodiazepines because of taking the Adderall.  Therefore the simplest solution would be an increase in Abilify.  Have her increase Abilify to  1-1/2 of the 5 mg tablets or 7.5 mg daily.  We can go as high as 10 mg if needed.  She should see improvement within a couple of weeks. . 04/27/2020 appointment with the following noted: Adding 5 mg to total of 10 mg Abilify helped. Changed jobs to help deal with the stress and it's better now after leaving that terrrible job.   Increase Abilify to 10 mg helped additionally with depression and anxiety. Asks about the increase Adderall to TID bc of longer hours.    Patient denies difficulty with sleep initiation or maintenance. Denies appetite disturbance.    Patient has chronic difficulty with concentration.  Patient denies any suicidal ideation.  Both D's on Adderall.    A little anxiety re: work.  Otherwise anxiety is manageable.  Past Psychiatric Medication Trials: Sertraline, Pristiq, venlafaxine, Wellbutrin 450, paroxetine, Viibryd helped too expensive,  Abilify 10 helped,   Adderall 60 , Vyvanse didn't like  She has been under our care since 2000  Review of Systems:  Review of Systems  Constitutional: Negative for fatigue and unexpected weight change.  Neurological: Negative for tremors and weakness.  Psychiatric/Behavioral: Positive for depression.    Medications: I have reviewed the patient's current medications.  Current Outpatient Medications  Medication Sig Dispense Refill  . albuterol (VENTOLIN HFA) 108 (90 Base) MCG/ACT inhaler 2 PUFF EVERY 4 6 HOURS FOR COUGH/WHEEZES    . amphetamine-dextroamphetamine (ADDERALL) 30 MG tablet Take 1 tablet by mouth 2 (two) times daily. 180 tablet 0  . ARIPiprazole (ABILIFY) 5 MG tablet Take 1 tablet (5 mg total) by mouth daily. (Patient taking differently: Take 10 mg by mouth daily. ) 90 tablet 1  . buPROPion (WELLBUTRIN XL) 150 MG 24 hr tablet Take 3 tablets (450 mg total) by mouth daily. 270 tablet 1  . fluticasone (FLONASE) 50 MCG/ACT nasal spray fluticasone propionate 50 mcg/actuation nasal spray,suspension  SPRAY 2 SPRAYS INTO  EACH NOSTRIL EVERY DAY    . hydrocortisone 2.5 % ointment hydrocortisone 2.5 % topical ointment  1 APPLICATION TO AFFECTED AREA TWICE A DAY EXTERNALLY 14 DAYS    . Ibuprofen-Famotidine (DUEXIS) 800-26.6 MG TABS Duexis 800 mg-26.6 mg tablet  TAKE ONE TABLET BY MOUTH THREE TIMES DAILY    . meclizine (ANTIVERT) 25 MG tablet meclizine 25 mg tablet     No current facility-administered medications for this visit.    Medication Side Effects: None  Allergies: No Known Allergies  History reviewed. No pertinent past medical history.  History reviewed. No pertinent family history.  Social History   Socioeconomic History  . Marital status: Married    Spouse name: Not on file  . Number of children: Not on file  . Years of education: Not on file  . Highest education level: Not on file  Occupational History  . Not on file  Tobacco Use  . Smoking status: Former Games developermoker  . Smokeless tobacco: Never Used  Substance and Sexual Activity  . Alcohol use: Not on file  . Drug  use: Not on file  . Sexual activity: Not on file  Other Topics Concern  . Not on file  Social History Narrative  . Not on file   Social Determinants of Health   Financial Resource Strain:   . Difficulty of Paying Living Expenses: Not on file  Food Insecurity:   . Worried About Programme researcher, broadcasting/film/video in the Last Year: Not on file  . Ran Out of Food in the Last Year: Not on file  Transportation Needs:   . Lack of Transportation (Medical): Not on file  . Lack of Transportation (Non-Medical): Not on file  Physical Activity:   . Days of Exercise per Week: Not on file  . Minutes of Exercise per Session: Not on file  Stress:   . Feeling of Stress : Not on file  Social Connections:   . Frequency of Communication with Friends and Family: Not on file  . Frequency of Social Gatherings with Friends and Family: Not on file  . Attends Religious Services: Not on file  . Active Member of Clubs or Organizations: Not on file  .  Attends Banker Meetings: Not on file  . Marital Status: Not on file  Intimate Partner Violence:   . Fear of Current or Ex-Partner: Not on file  . Emotionally Abused: Not on file  . Physically Abused: Not on file  . Sexually Abused: Not on file    Past Medical History, Surgical history, Social history, and Family history were reviewed and updated as appropriate.   Please see review of systems for further details on the patient's review from today.   Objective:   Physical Exam:  There were no vitals taken for this visit.  Physical Exam Neurological:     Mental Status: She is alert and oriented to person, place, and time.     Cranial Nerves: No dysarthria.  Psychiatric:        Attention and Perception: Attention normal.        Mood and Affect: Mood is anxious. Mood is not depressed.        Speech: Speech normal.        Behavior: Behavior is cooperative.        Thought Content: Thought content normal. Thought content is not paranoid or delusional. Thought content does not include homicidal or suicidal ideation. Thought content does not include homicidal or suicidal plan.        Cognition and Memory: Cognition and memory normal.        Judgment: Judgment normal.     Comments: Insight good. Much better dep and anxiety     Lab Review:  No results found for: NA, K, CL, CO2, GLUCOSE, BUN, CREATININE, CALCIUM, PROT, ALBUMIN, AST, ALT, ALKPHOS, BILITOT, GFRNONAA, GFRAA  No results found for: WBC, RBC, HGB, HCT, PLT, MCV, MCH, MCHC, RDW, LYMPHSABS, MONOABS, EOSABS, BASOSABS  No results found for: POCLITH, LITHIUM   No results found for: PHENYTOIN, PHENOBARB, VALPROATE, CBMZ   .res Assessment: Plan:    Recurrent major depression in partial remission (HCC)  Generalized anxiety disorder  Attention deficit hyperactivity disorder (ADHD), combined type   At 11/06/2019 appointment she was struggling with moderate depression and anxiety but also had recent panic attacks.   The Adderall was inconsistent.  We discussed various options and decided to potentiate Wellbutrin with the Abilify which has been very successful and she is tolerated it well.  Her anxiety and depression are under control now without recent panic but this required  a further increase in Abilify June 2021 to 10 mg daily. Residual anxiety but less stressed with job change.  She is at the usual maximum dosage of Adderall 60 mg daily.  I am hesitant to go further up.  I suggested instead that she split the 60 mg up into 3 doses instead of 2 doses to try to get greater extension of hours.  We mentioned and discussed the option of long-acting stimulant such as Mydayis to cover greater part of the day in a more smooth manner but it is too expensive her for her to do that.    Discussed potential metabolic side effects associated with atypical antipsychotics, as well as potential risk for movement side effects. Advised pt to contact office if movement side effects occur.  Abilify 10 mg daily was markedly helpful for depression and anxiety. Continue Wellbutrin XL 450 mg every morning Continue Adderall 30 mg twice daily or split 2 tablets up to TID.Marland Kitchen  Follow-up 4 months  Meredith Staggers, MD, DFAPA   Please see After Visit Summary for patient specific instructions.  No future appointments.  No orders of the defined types were placed in this encounter.     -------------------------------

## 2020-05-11 ENCOUNTER — Other Ambulatory Visit: Payer: Self-pay | Admitting: Internal Medicine

## 2020-05-11 DIAGNOSIS — Z1231 Encounter for screening mammogram for malignant neoplasm of breast: Secondary | ICD-10-CM

## 2020-08-11 ENCOUNTER — Telehealth: Payer: Self-pay | Admitting: Psychiatry

## 2020-08-11 ENCOUNTER — Other Ambulatory Visit: Payer: Self-pay | Admitting: Psychiatry

## 2020-08-11 DIAGNOSIS — F331 Major depressive disorder, recurrent, moderate: Secondary | ICD-10-CM

## 2020-08-11 DIAGNOSIS — F5081 Binge eating disorder: Secondary | ICD-10-CM

## 2020-08-11 DIAGNOSIS — F902 Attention-deficit hyperactivity disorder, combined type: Secondary | ICD-10-CM

## 2020-08-11 DIAGNOSIS — F411 Generalized anxiety disorder: Secondary | ICD-10-CM

## 2020-08-11 MED ORDER — BUPROPION HCL ER (XL) 150 MG PO TB24: 450.0000 mg | ORAL_TABLET | Freq: Every day | ORAL | 0 refills | Status: DC

## 2020-08-11 MED ORDER — ARIPIPRAZOLE 10 MG PO TABS: 10.0000 mg | ORAL_TABLET | Freq: Every day | ORAL | 0 refills | Status: DC

## 2020-08-11 MED ORDER — AMPHETAMINE-DEXTROAMPHETAMINE 30 MG PO TABS: 30.0000 mg | ORAL_TABLET | Freq: Two times a day (BID) | ORAL | 0 refills | Status: DC

## 2020-08-11 NOTE — Telephone Encounter (Signed)
Patient called in stating she needed refills on her medications.  Adderall 30mg , Abilify 10mg , and Wellbutrin 150mg . She has an appt 3/3. Pharmacy [facility] home delivery.

## 2020-08-19 ENCOUNTER — Telehealth (INDEPENDENT_AMBULATORY_CARE_PROVIDER_SITE_OTHER): Payer: 59 | Admitting: Psychiatry

## 2020-08-19 ENCOUNTER — Encounter: Payer: Self-pay | Admitting: Psychiatry

## 2020-08-19 DIAGNOSIS — F411 Generalized anxiety disorder: Secondary | ICD-10-CM

## 2020-08-19 DIAGNOSIS — F5081 Binge eating disorder: Secondary | ICD-10-CM

## 2020-08-19 DIAGNOSIS — F331 Major depressive disorder, recurrent, moderate: Secondary | ICD-10-CM | POA: Diagnosis not present

## 2020-08-19 DIAGNOSIS — F902 Attention-deficit hyperactivity disorder, combined type: Secondary | ICD-10-CM | POA: Diagnosis not present

## 2020-08-19 MED ORDER — ARIPIPRAZOLE 15 MG PO TABS: 15.0000 mg | ORAL_TABLET | Freq: Every day | ORAL | 0 refills | Status: DC

## 2020-08-19 MED ORDER — BUPROPION HCL ER (XL) 150 MG PO TB24: 450.0000 mg | ORAL_TABLET | Freq: Every day | ORAL | 1 refills | Status: DC

## 2020-08-19 NOTE — Progress Notes (Signed)
Alexa Perez [id number] 1955-09-11 65 y.o.   Video Visit via My Chart  I connected with pt by My Chart and verified that I am speaking with the correct person using two identifiers.   I discussed the limitations, risks, security and privacy concerns of performing an evaluation and management service by My Chart  and the availability of in person appointments. I also discussed with the patient that there may be a patient responsible charge related to this service. The patient expressed understanding and agreed to proceed.  I discussed the assessment and treatment plan with the patient. The patient was provided an opportunity to ask questions and all were answered. The patient agreed with the plan and demonstrated an understanding of the instructions.   The patient was advised to call back or seek an in-person evaluation if the symptoms worsen or if the condition fails to improve as anticipated.  I provided 30 minutes of video time during this encounter.  The patient was located at home and the provider was located office. started at 10 and ended at 1030  Subjective:   Patient ID:  Alexa Perez is a 65 y.o. (DOB 06-Jun-1956) female.  Chief Complaint:  Chief Complaint  Patient presents with  . Follow-up  . Recurrent major depression in partial remission (HCC)  . Anxiety    Depression        Associated symptoms include no fatigue.  Luz Lex presents to the office today for follow-up of ADHD, major depression, generalized anxiety disorder, and binge eating disorder.  seen April 2020.  We elected to retry the buspirone at low dose 5 mg twice a day for 1 week and then 10 mg twice a day for anxiety. She continued on Wellbutrin XL 450.  She had previously stopped Viibryd due to cost.  seen October 2020.  She had residual depression and it was suggested she return to Abilify which is previously been helpful.  She was to follow-up in 3 months but it has been about 6  months.  seen 09/12/19 with the followining noted: Had massive anxiety with Panic with Covid early Feb and felt she couldn't breathe.Had not taken anything during Covid and slept a lot.  Restarted antidepressant after Covid.  Missing some.  Still very lethargic and SOB related to Covid.  Intermittent loss of taste. Never took Abilify consistently since here.  Was taking Wellbutrin consistently not Buspar before Covid.  Pre-Covid had still been unmotivated with low energy and productivity.  Adderall would work for that.  Adderall compliance also varies. Enough sleep. Anhedonia not sad. Depression 5/10.  Anxiety  5/10.   Plan: Start Abilify 5 mg 1/2 tablet each morning for 1 week and if no benefit increase to  5 smg daily. Stop buspirone dT polypharmacy and compliance problems. Be more consistent with Wellbutrin otherwise will not work.  11/07/2019 appointment the following is noted: Abilify helped.  Depression started going away.  Anxiety better and handling things better and more assertive.  Doesn't want a change.  Tolerating it well. Sleep plenty.  Awakening on time generally. Adderall first thing in AM and helped to take it in a different pattern. Adderall is fine generally. Still having ADD sx even with the meds.  Needs it mostly.  She is no longer having elevated heart rate.  She likes the short acting because she can control the time and duration better than with slow release. Plan: No med changes  12/01/2019 phone call from patient with the following  noted: She is on Wellbutrin, Adderall, and Abilify 5 mg was last med added which helped her depression.  She has taken buspirone in the past but had difficulty with compliance because of twice daily dosing. She wants something for anxiety.  We could consider starting an SSRI but historically she has wanted to avoid those.  We want to avoid benzodiazepines because of taking the Adderall.  Therefore the simplest solution would be an increase in  Abilify.  Have her increase Abilify to 1-1/2 of the 5 mg tablets or 7.5 mg daily.  We can go as high as 10 mg if needed.  She should see improvement within a couple of weeks. . 04/27/2020 appointment with the following noted: Adding 5 mg to total of 10 mg Abilify helped. Changed jobs to help deal with the stress and it's better now after leaving that terrrible job.   Increase Abilify to 10 mg helped additionally with depression and anxiety. Asks about the increase Adderall to TID bc of longer hours.   Plan: Abilify 10 mg daily was markedly helpful for depression and anxiety. Continue Wellbutrin XL 450 mg every morning Continue Adderall 30 mg twice daily or split 2 tablets up to TID..  08/18/20 appt noted: Doing well.  Occ forgets meds and feels bad.  Feels great. Not even stressed out as new job with some $ stress.  Handling stress much better.   Accidentally increased to 20 mg Abilify taking 2 of the 10 mg tablets.  Feels it if forgets the med.  No SE.  Sleepy if don't take med. Lost 30#.    Patient denies difficulty with sleep initiation or maintenance. Denies appetite disturbance.    Patient has chronic difficulty with concentration.  Patient denies any suicidal ideation.  Both D's on Adderall.    A little anxiety re: work.  Otherwise anxiety is manageable.  Past Psychiatric Medication Trials: Sertraline, Pristiq, venlafaxine, Wellbutrin 450, paroxetine, Viibryd helped too expensive,  Abilify 10 helped,   Adderall 60 , Vyvanse didn't like  She has been under our care since 2000  Review of Systems:  Review of Systems  Constitutional: Negative for fatigue and unexpected weight change.  Neurological: Negative for tremors and weakness.  Psychiatric/Behavioral: Positive for depression.    Medications: I have reviewed the patient's current medications.  Current Outpatient Medications  Medication Sig Dispense Refill  . albuterol (VENTOLIN HFA) 108 (90 Base) MCG/ACT inhaler 2 PUFF  EVERY 4 6 HOURS FOR COUGH/WHEEZES    . amphetamine-dextroamphetamine (ADDERALL) 30 MG tablet Take 1 tablet by mouth 2 (two) times daily. 180 tablet 0  . fluticasone (FLONASE) 50 MCG/ACT nasal spray fluticasone propionate 50 mcg/actuation nasal spray,suspension  SPRAY 2 SPRAYS INTO EACH NOSTRIL EVERY DAY    . hydrocortisone 2.5 % ointment hydrocortisone 2.5 % topical ointment  1 APPLICATION TO AFFECTED AREA TWICE A DAY EXTERNALLY 14 DAYS    . Ibuprofen-Famotidine 800-26.6 MG TABS Duexis 800 mg-26.6 mg tablet  TAKE ONE TABLET BY MOUTH THREE TIMES DAILY    . ARIPiprazole (ABILIFY) 15 MG tablet Take 1 tablet (15 mg total) by mouth daily. 90 tablet 0  . buPROPion (WELLBUTRIN XL) 150 MG 24 hr tablet Take 3 tablets (450 mg total) by mouth daily. 270 tablet 1  . meclizine (ANTIVERT) 25 MG tablet meclizine 25 mg tablet (Patient not taking: Reported on 08/19/2020)     No current facility-administered medications for this visit.    Medication Side Effects: None  Allergies: No Known Allergies  History  reviewed. No pertinent past medical history.  History reviewed. No pertinent family history.  Social History   Socioeconomic History  . Marital status: Married    Spouse name: Not on file  . Number of children: Not on file  . Years of education: Not on file  . Highest education level: Not on file  Occupational History  . Not on file  Tobacco Use  . Smoking status: Former Games developer  . Smokeless tobacco: Never Used  Substance and Sexual Activity  . Alcohol use: Not on file  . Drug use: Not on file  . Sexual activity: Not on file  Other Topics Concern  . Not on file  Social History Narrative  . Not on file   Social Determinants of Health   Financial Resource Strain: Not on file  Food Insecurity: Not on file  Transportation Needs: Not on file  Physical Activity: Not on file  Stress: Not on file  Social Connections: Not on file  Intimate Partner Violence: Not on file    Past Medical  History, Surgical history, Social history, and Family history were reviewed and updated as appropriate.   Please see review of systems for further details on the patient's review from today.   Objective:   Physical Exam:  There were no vitals taken for this visit.  Physical Exam Neurological:     Mental Status: She is alert and oriented to person, place, and time.     Cranial Nerves: No dysarthria.  Psychiatric:        Attention and Perception: Attention normal.        Mood and Affect: Mood is anxious. Mood is not depressed.        Speech: Speech normal.        Behavior: Behavior is cooperative.        Thought Content: Thought content normal. Thought content is not paranoid or delusional. Thought content does not include homicidal or suicidal ideation. Thought content does not include homicidal or suicidal plan.        Cognition and Memory: Cognition and memory normal.        Judgment: Judgment normal.     Comments: Insight good. Much better dep and anxiety     Lab Review:  No results found for: NA, K, CL, CO2, GLUCOSE, BUN, CREATININE, CALCIUM, PROT, ALBUMIN, AST, ALT, ALKPHOS, BILITOT, GFRNONAA, GFRAA  No results found for: WBC, RBC, HGB, HCT, PLT, MCV, MCH, MCHC, RDW, LYMPHSABS, MONOABS, EOSABS, BASOSABS  No results found for: POCLITH, LITHIUM   No results found for: PHENYTOIN, PHENOBARB, VALPROATE, CBMZ   .res Assessment: Plan:    Generalized anxiety disorder - Plan: ARIPiprazole (ABILIFY) 15 MG tablet  Major depressive disorder, recurrent episode, moderate (HCC) - Plan: ARIPiprazole (ABILIFY) 15 MG tablet, buPROPion (WELLBUTRIN XL) 150 MG 24 hr tablet  Attention deficit hyperactivity disorder (ADHD), combined type  Binge eating disorder - Plan: buPROPion (WELLBUTRIN XL) 150 MG 24 hr tablet   At 11/06/2019 appointment she was struggling with moderate depression and anxiety but also had recent panic attacks.  The Adderall was inconsistent.  We discussed various options  and decided to potentiate Wellbutrin with the Abilify which has been very successful and she is tolerated it well.  Her anxiety and depression are under control now without recent panic but this required a further increase in Abilify June 2021 to 10 mg daily.  Since the fall 2021 she accidentally increased Abilify from 10 to 20 mg without side effects and feels even less  anxious.  She has had no side effects.  She is at the usual maximum dosage of Adderall 60 mg daily.  .  We mentioned and discussed the option of long-acting stimulant such as Mydayis to cover greater part of the day in a more smooth manner but it is too expensive her for her to do that.    She is likely to do just as well with a slightly lower dose of Abilify so we will reduce the dose to 15 mg in order to minimize the risk of long-term side effects.  She is not having weight gain and in fact is losing weight. Discussed potential metabolic side effects associated with atypical antipsychotics, as well as potential risk for movement side effects. Advised pt to contact office if movement side effects occur.  Abilify 15 mg daily was markedly helpful for depression and anxiety. Continue Wellbutrin XL 450 mg every morning Continue Adderall 30 mg twice daily or split 2 tablets up to TID.Marland Kitchen  Follow-up 4 months  Meredith Staggers, MD, DFAPA   Please see After Visit Summary for patient specific instructions.  No future appointments.  No orders of the defined types were placed in this encounter.     -------------------------------

## 2020-08-26 ENCOUNTER — Telehealth: Payer: Self-pay

## 2020-08-26 NOTE — Telephone Encounter (Signed)
Prior Authorization for BUPROPION XL 150 MG TABS ER 24 H has been approved by [employer]. Effective dates 07/21/2020-08/20/2023 Case [id number]

## 2020-11-01 ENCOUNTER — Other Ambulatory Visit: Payer: Self-pay | Admitting: Psychiatry

## 2020-11-01 DIAGNOSIS — F411 Generalized anxiety disorder: Secondary | ICD-10-CM

## 2020-11-01 DIAGNOSIS — F331 Major depressive disorder, recurrent, moderate: Secondary | ICD-10-CM

## 2020-11-18 ENCOUNTER — Telehealth: Payer: Self-pay | Admitting: Psychiatry

## 2020-11-18 ENCOUNTER — Other Ambulatory Visit: Payer: Self-pay | Admitting: Psychiatry

## 2020-11-18 DIAGNOSIS — F5081 Binge eating disorder: Secondary | ICD-10-CM

## 2020-11-18 DIAGNOSIS — F902 Attention-deficit hyperactivity disorder, combined type: Secondary | ICD-10-CM

## 2020-11-18 DIAGNOSIS — F50819 Binge eating disorder, unspecified: Secondary | ICD-10-CM

## 2020-11-18 DIAGNOSIS — F331 Major depressive disorder, recurrent, moderate: Secondary | ICD-10-CM

## 2020-11-18 MED ORDER — BUPROPION HCL ER (XL) 150 MG PO TB24: 450.0000 mg | ORAL_TABLET | Freq: Every day | ORAL | 0 refills | Status: DC

## 2020-11-18 MED ORDER — AMPHETAMINE-DEXTROAMPHETAMINE 30 MG PO TABS: 30.0000 mg | ORAL_TABLET | Freq: Two times a day (BID) | ORAL | 0 refills | Status: DC

## 2020-11-18 NOTE — Telephone Encounter (Signed)
Pt would like a refill on Adderall and Wellbutrin. Please send in at [facility].

## 2021-01-31 ENCOUNTER — Other Ambulatory Visit: Payer: Self-pay | Admitting: Psychiatry

## 2021-01-31 DIAGNOSIS — F331 Major depressive disorder, recurrent, moderate: Secondary | ICD-10-CM

## 2021-01-31 DIAGNOSIS — F411 Generalized anxiety disorder: Secondary | ICD-10-CM

## 2021-02-09 ENCOUNTER — Encounter: Payer: Self-pay | Admitting: Psychiatry

## 2021-02-09 ENCOUNTER — Telehealth (INDEPENDENT_AMBULATORY_CARE_PROVIDER_SITE_OTHER): Payer: 59 | Admitting: Psychiatry

## 2021-02-09 DIAGNOSIS — F902 Attention-deficit hyperactivity disorder, combined type: Secondary | ICD-10-CM

## 2021-02-09 DIAGNOSIS — F411 Generalized anxiety disorder: Secondary | ICD-10-CM

## 2021-02-09 DIAGNOSIS — F331 Major depressive disorder, recurrent, moderate: Secondary | ICD-10-CM

## 2021-02-09 DIAGNOSIS — F5081 Binge eating disorder: Secondary | ICD-10-CM

## 2021-02-09 DIAGNOSIS — F50819 Binge eating disorder, unspecified: Secondary | ICD-10-CM

## 2021-02-09 MED ORDER — ARIPIPRAZOLE 15 MG PO TABS: 15.0000 mg | ORAL_TABLET | Freq: Every day | ORAL | 1 refills | Status: DC

## 2021-02-09 MED ORDER — AMPHETAMINE-DEXTROAMPHETAMINE 30 MG PO TABS: 30.0000 mg | ORAL_TABLET | Freq: Two times a day (BID) | ORAL | 0 refills | Status: DC

## 2021-02-09 MED ORDER — BUPROPION HCL ER (XL) 150 MG PO TB24: 450.0000 mg | ORAL_TABLET | Freq: Every day | ORAL | 1 refills | Status: DC

## 2021-02-09 NOTE — Progress Notes (Signed)
Alexa Perez [id number] 1956/04/18 65 y.o.   Video Visit via My Chart  I connected with pt by My Chart and verified that I am speaking with the correct person using two identifiers.   I discussed the limitations, risks, security and privacy concerns of performing an evaluation and management service by My Chart  and the availability of in person appointments. I also discussed with the patient that there may be a patient responsible charge related to this service. The patient expressed understanding and agreed to proceed.  I discussed the assessment and treatment plan with the patient. The patient was provided an opportunity to ask questions and all were answered. The patient agreed with the plan and demonstrated an understanding of the instructions.   The patient was advised to call back or seek an in-person evaluation if the symptoms worsen or if the condition fails to improve as anticipated.  I provided 30 minutes of video time during this encounter.  The patient was located at home and the provider was located office. started at 10 and ended at 1030  Subjective:   Patient ID:  Alexa Perez is a 65 y.o. (DOB 1955-07-25) female.  Chief Complaint:  Chief Complaint  Patient presents with   Follow-up   Anxiety   Depression    Depression        Associated symptoms include no fatigue. Luz Lex presents to the office today for follow-up of ADHD, major depression, generalized anxiety disorder, and binge eating disorder.  seen April 2020.  We elected to retry the buspirone at low dose 5 mg twice a day for 1 week and then 10 mg twice a day for anxiety. She continued on Wellbutrin XL 450.  She had previously stopped Viibryd due to cost.  seen October 2020.  She had residual depression and it was suggested she return to Abilify which is previously been helpful.  She was to follow-up in 3 months but it has been about 6 months.  seen 09/12/19 with the followining noted: Had  massive anxiety with Panic with Covid early Feb and felt she couldn't breathe.Had not taken anything during Covid and slept a lot.  Restarted antidepressant after Covid.  Missing some.  Still very lethargic and SOB related to Covid.  Intermittent loss of taste. Never took Abilify consistently since here.  Was taking Wellbutrin consistently not Buspar before Covid.  Pre-Covid had still been unmotivated with low energy and productivity.  Adderall would work for that.  Adderall compliance also varies. Enough sleep. Anhedonia not sad. Depression 5/10.  Anxiety  5/10.   Plan: Start Abilify 5 mg 1/2 tablet each morning for 1 week and if no benefit increase to  5 smg daily. Stop buspirone dT polypharmacy and compliance problems. Be more consistent with Wellbutrin otherwise will not work.  11/07/2019 appointment the following is noted: Abilify helped.  Depression started going away.  Anxiety better and handling things better and more assertive.  Doesn't want a change.  Tolerating it well. Sleep plenty.  Awakening on time generally. Adderall first thing in AM and helped to take it in a different pattern. Adderall is fine generally. Still having ADD sx even with the meds.  Needs it mostly.  She is no longer having elevated heart rate.  She likes the short acting because she can control the time and duration better than with slow release. Plan: No med changes  12/01/2019 phone call from patient with the following noted: She is on Wellbutrin, Adderall, and  Abilify 5 mg was last med added which helped her depression.  She has taken buspirone in the past but had difficulty with compliance because of twice daily dosing. She wants something for anxiety.  We could consider starting an SSRI but historically she has wanted to avoid those.  We want to avoid benzodiazepines because of taking the Adderall.  Therefore the simplest solution would be an increase in Abilify.  Have her increase Abilify to 1-1/2 of the 5 mg  tablets or 7.5 mg daily.  We can go as high as 10 mg if needed.  She should see improvement within a couple of weeks. . 04/27/2020 appointment with the following noted: Adding 5 mg to total of 10 mg Abilify helped. Changed jobs to help deal with the stress and it's better now after leaving that terrrible job.   Increase Abilify to 10 mg helped additionally with depression and anxiety. Asks about the increase Adderall to TID bc of longer hours.   Plan: Abilify 10 mg daily was markedly helpful for depression and anxiety. Continue Wellbutrin XL 450 mg every morning Continue Adderall 30 mg twice daily or split 2 tablets up to TID..  08/18/20 appt noted: Doing well.  Occ forgets meds and feels bad.  Feels great. Not even stressed out as new job with some $ stress.  Handling stress much better.   Accidentally increased to 20 mg Abilify taking 2 of the 10 mg tablets.  Feels it if forgets the med.  No SE.  Sleepy if don't take med. Lost 30#.   Plan: Reduce Abilify from 20 to 15 mg daily was markedly helpful for depression and anxiety. Continue Wellbutrin XL 450 mg every morning Continue Adderall 30 mg twice daily or split 2 tablets up to TID..  02/09/2021 appointment with the following noted: Doing well.  Struggling with weight and hard to do anything about it. Knee pain from weight. Patient reports stable mood and mild depressed or irritable moods.  Patient denies any recent difficulty with anxiety.  Patient denies difficulty with sleep initiation or maintenance. Denies appetite disturbance.  Patient reports that energy and motivation have been good.  Patient denies any difficulty with concentration.  Patient denies any suicidal ideation. Benefit from Adderall. More consistent by use of pill box. Satisfied with meds.  Both D's on Adderall.    A little anxiety re: work.  Otherwise anxiety is manageable.  Past Psychiatric Medication Trials: Sertraline, Pristiq, venlafaxine, Wellbutrin 450,  paroxetine, Viibryd helped too expensive,  Abilify 10 helped,   Adderall 60 , Vyvanse didn't like topiramate  She has been under our care since 2000  Review of Systems:  Review of Systems  Constitutional:  Positive for unexpected weight change. Negative for fatigue.  Neurological:  Negative for tremors and weakness.   Medications: I have reviewed the patient's current medications.  Current Outpatient Medications  Medication Sig Dispense Refill   fluticasone (FLONASE) 50 MCG/ACT nasal spray fluticasone propionate 50 mcg/actuation nasal spray,suspension  SPRAY 2 SPRAYS INTO EACH NOSTRIL EVERY DAY     hydrocortisone 2.5 % ointment hydrocortisone 2.5 % topical ointment  1 APPLICATION TO AFFECTED AREA TWICE A DAY EXTERNALLY 14 DAYS     Ibuprofen-Famotidine 800-26.6 MG TABS Duexis 800 mg-26.6 mg tablet  TAKE ONE TABLET BY MOUTH THREE TIMES DAILY     amphetamine-dextroamphetamine (ADDERALL) 30 MG tablet Take 1 tablet by mouth 2 (two) times daily. 180 tablet 0   ARIPiprazole (ABILIFY) 15 MG tablet Take 1 tablet (15 mg total)  by mouth daily. 90 tablet 1   buPROPion (WELLBUTRIN XL) 150 MG 24 hr tablet Take 3 tablets (450 mg total) by mouth daily. 270 tablet 1   meclizine (ANTIVERT) 25 MG tablet meclizine 25 mg tablet (Patient not taking: No sig reported)     No current facility-administered medications for this visit.    Medication Side Effects: None  Allergies: No Known Allergies  History reviewed. No pertinent past medical history.  History reviewed. No pertinent family history.  Social History   Socioeconomic History   Marital status: Married    Spouse name: Not on file   Number of children: Not on file   Years of education: Not on file   Highest education level: Not on file  Occupational History   Not on file  Tobacco Use   Smoking status: Former   Smokeless tobacco: Never  Substance and Sexual Activity   Alcohol use: Not on file   Drug use: Not on file   Sexual  activity: Not on file  Other Topics Concern   Not on file  Social History Narrative   Not on file   Social Determinants of Health   Financial Resource Strain: Not on file  Food Insecurity: Not on file  Transportation Needs: Not on file  Physical Activity: Not on file  Stress: Not on file  Social Connections: Not on file  Intimate Partner Violence: Not on file    Past Medical History, Surgical history, Social history, and Family history were reviewed and updated as appropriate.   Please see review of systems for further details on the patient's review from today.   Objective:   Physical Exam:  There were no vitals taken for this visit.  Physical Exam Neurological:     Mental Status: She is alert and oriented to person, place, and time.     Cranial Nerves: No dysarthria.  Psychiatric:        Attention and Perception: Attention normal. She does not perceive auditory hallucinations.        Mood and Affect: Mood is anxious. Mood is not depressed.        Speech: Speech normal. Speech is not slurred.        Behavior: Behavior is cooperative.        Thought Content: Thought content normal. Thought content is not paranoid or delusional. Thought content does not include homicidal or suicidal ideation. Thought content does not include homicidal or suicidal plan.        Cognition and Memory: Cognition and memory normal.        Judgment: Judgment normal.     Comments: Insight good. Much better dep and anxiety    Lab Review:  No results found for: NA, K, CL, CO2, GLUCOSE, BUN, CREATININE, CALCIUM, PROT, ALBUMIN, AST, ALT, ALKPHOS, BILITOT, GFRNONAA, GFRAA  No results found for: WBC, RBC, HGB, HCT, PLT, MCV, MCH, MCHC, RDW, LYMPHSABS, MONOABS, EOSABS, BASOSABS  No results found for: POCLITH, LITHIUM   No results found for: PHENYTOIN, PHENOBARB, VALPROATE, CBMZ   .res Assessment: Plan:    Major depressive disorder, recurrent episode, moderate (HCC) - Plan: ARIPiprazole (ABILIFY)  15 MG tablet, buPROPion (WELLBUTRIN XL) 150 MG 24 hr tablet  Attention deficit hyperactivity disorder (ADHD), combined type - Plan: amphetamine-dextroamphetamine (ADDERALL) 30 MG tablet  Generalized anxiety disorder - Plan: ARIPiprazole (ABILIFY) 15 MG tablet  Binge eating disorder - Plan: amphetamine-dextroamphetamine (ADDERALL) 30 MG tablet, buPROPion (WELLBUTRIN XL) 150 MG 24 hr tablet   At 11/06/2019 appointment she  was struggling with moderate depression and anxiety but also had recent panic attacks.  The Adderall was inconsistent.  We discussed various options and decided to potentiate Wellbutrin with the Abilify which has been very successful and she is tolerated it well.  Her anxiety and depression are under control now without recent panic but this required a further increase in Abilify June 2021 to 10 mg daily.  Since the fall 2021 she accidentally increased Abilify from 10 to 20 mg without side effects and feels even less anxious.  She has had no side effects.  Drop dose of Abilify back to 15 mg daily early 2022 with no loss of benefit.  She is at the usual maximum dosage of Adderall 60 mg daily.  .  We mentioned and discussed the option of long-acting stimulant such as Mydayis to cover greater part of the day in a more smooth manner but it is too expensive her for her to do that.    Discussed potential metabolic side effects associated with atypical antipsychotics, as well as potential risk for movement side effects. Advised pt to contact office if movement side effects occur.  Abilify 15 mg daily was markedly helpful for depression and anxiety. Continue Wellbutrin XL 450 mg every morning Continue Adderall 30 mg twice daily or split 2 tablets up to TID.Marland Kitchen.  Option topiramate  .  Made her foggy in the past. Rec pursue Wegovy.  Disc this category of weight loss med in detail including side effects and alternatives within the category.  Suggest she talk with primary care about it first but if  they cannot help her with it [facility].  Follow-up 6 months  Meredith Staggersarey Cottle, MD, DFAPA   Please see After Visit Summary for patient specific instructions.  No future appointments.   No orders of the defined types were placed in this encounter.     -------------------------------

## 2021-06-01 ENCOUNTER — Telehealth: Payer: Self-pay | Admitting: Psychiatry

## 2021-06-01 ENCOUNTER — Other Ambulatory Visit: Payer: Self-pay

## 2021-06-01 DIAGNOSIS — F902 Attention-deficit hyperactivity disorder, combined type: Secondary | ICD-10-CM

## 2021-06-01 DIAGNOSIS — F5081 Binge eating disorder: Secondary | ICD-10-CM

## 2021-06-01 MED ORDER — AMPHETAMINE-DEXTROAMPHETAMINE 30 MG PO TABS: 30.0000 mg | ORAL_TABLET | Freq: Two times a day (BID) | ORAL | 0 refills | Status: DC

## 2021-06-01 NOTE — Telephone Encounter (Signed)
Pended.

## 2021-06-01 NOTE — Telephone Encounter (Signed)
Pt called and said she needs a new sceipt of her adderall sent to [facility]. Her next appt is 1/31

## 2021-06-03 ENCOUNTER — Other Ambulatory Visit: Payer: Self-pay | Admitting: Psychiatry

## 2021-06-03 DIAGNOSIS — F5081 Binge eating disorder: Secondary | ICD-10-CM

## 2021-06-03 DIAGNOSIS — F902 Attention-deficit hyperactivity disorder, combined type: Secondary | ICD-10-CM

## 2021-06-03 MED ORDER — AMPHETAMINE-DEXTROAMPHETAMINE 30 MG PO TABS: 30.0000 mg | ORAL_TABLET | Freq: Two times a day (BID) | ORAL | 0 refills | Status: DC

## 2021-06-03 NOTE — Telephone Encounter (Signed)
Alexa Perez called because [facility] does not have the Adderall and can't fill her order for it.  [facility] [address] in [place], [place].  They had it yesterday so perhaps they will still have some when you send in the prescription.

## 2021-06-15 MED ORDER — AMPHETAMINE-DEXTROAMPHETAMINE 30 MG PO TABS: 30.0000 mg | ORAL_TABLET | Freq: Two times a day (BID) | ORAL | 0 refills | Status: DC

## 2021-06-15 NOTE — Telephone Encounter (Signed)
[facility] [address] [facility] [place] [facility] 30 day supply

## 2021-07-13 ENCOUNTER — Telehealth: Payer: Self-pay | Admitting: Psychiatry

## 2021-07-13 NOTE — Telephone Encounter (Signed)
Send Adderall Rf in to : [facility] [address] in [place], [place] [address]

## 2021-07-15 ENCOUNTER — Other Ambulatory Visit: Payer: Self-pay

## 2021-07-15 DIAGNOSIS — F902 Attention-deficit hyperactivity disorder, combined type: Secondary | ICD-10-CM

## 2021-07-15 DIAGNOSIS — F5081 Binge eating disorder: Secondary | ICD-10-CM

## 2021-07-15 NOTE — Telephone Encounter (Signed)
Pended to requested pharmacy.  

## 2021-07-15 NOTE — Telephone Encounter (Signed)
Send Adderall Rf in to : [facility] [address] in [place], [place] [address]   I encountered a problem with routing this message. Note that request was two days ago.

## 2021-07-17 MED ORDER — AMPHETAMINE-DEXTROAMPHETAMINE 30 MG PO TABS: 30.0000 mg | ORAL_TABLET | Freq: Two times a day (BID) | ORAL | 0 refills | Status: DC

## 2021-07-19 ENCOUNTER — Ambulatory Visit: Payer: 59 | Admitting: Psychiatry

## 2021-08-01 DIAGNOSIS — M170 Bilateral primary osteoarthritis of knee: Secondary | ICD-10-CM | POA: Diagnosis not present

## 2021-08-17 ENCOUNTER — Telehealth: Payer: Self-pay | Admitting: Psychiatry

## 2021-08-17 NOTE — Telephone Encounter (Signed)
Patient last filled 2/10, too early to RF.  ?

## 2021-08-17 NOTE — Telephone Encounter (Signed)
Next visit is 09/13/21. Requesting refill on Adderall 30 mg. [facility] [place] [facility]. [facility] [facility] ?[place], [place] [address] ?Phone number is [phone] ?

## 2021-08-18 NOTE — Telephone Encounter (Signed)
Patient notified. She will call back when she is due and after checking pharmacy availability.  ?

## 2021-08-24 ENCOUNTER — Ambulatory Visit (INDEPENDENT_AMBULATORY_CARE_PROVIDER_SITE_OTHER): Payer: Self-pay | Admitting: Psychiatry

## 2021-08-24 ENCOUNTER — Other Ambulatory Visit: Payer: Self-pay

## 2021-08-24 ENCOUNTER — Encounter: Payer: Self-pay | Admitting: Psychiatry

## 2021-08-24 VITALS — BP 132/86 | HR 79

## 2021-08-24 DIAGNOSIS — F5081 Binge eating disorder: Secondary | ICD-10-CM

## 2021-08-24 DIAGNOSIS — F902 Attention-deficit hyperactivity disorder, combined type: Secondary | ICD-10-CM

## 2021-08-24 DIAGNOSIS — F411 Generalized anxiety disorder: Secondary | ICD-10-CM

## 2021-08-24 DIAGNOSIS — F331 Major depressive disorder, recurrent, moderate: Secondary | ICD-10-CM

## 2021-08-24 MED ORDER — BUPROPION HCL ER (XL) 150 MG PO TB24: 450.0000 mg | ORAL_TABLET | Freq: Every day | ORAL | 1 refills | Status: DC

## 2021-08-24 MED ORDER — ARIPIPRAZOLE 15 MG PO TABS: 15.0000 mg | ORAL_TABLET | Freq: Every day | ORAL | 1 refills | Status: DC

## 2021-08-24 MED ORDER — AMPHETAMINE-DEXTROAMPHETAMINE 30 MG PO TABS: 30.0000 mg | ORAL_TABLET | Freq: Two times a day (BID) | ORAL | 0 refills | Status: DC

## 2021-08-24 NOTE — Progress Notes (Signed)
Alexa Perez [id number] Jan 26, 1956 66 y.o.    Subjective:   Patient ID:  Alexa Perez is a 66 y.o. (DOB 07-Oct-1955) female.  Chief Complaint:  Chief Complaint  Patient presents with   Follow-up   Anxiety   Depression    Depression        Associated symptoms include no fatigue.  Past medical history includes anxiety.   Anxiety    Alexa Perez presents to the office today for follow-up of ADHD, major depression, generalized anxiety disorder, and binge eating disorder.  seen April 2020.  We elected to retry the buspirone at low dose 5 mg twice a day for 1 week and then 10 mg twice a day for anxiety. She continued on Wellbutrin XL 450.  She had previously stopped Viibryd due to cost.  seen October 2020.  She had residual depression and it was suggested she return to Abilify which is previously been helpful.  She was to follow-up in 3 months but it has been about 6 months.  seen 09/12/19 with the followining noted: Had massive anxiety with Panic with Covid early Feb and felt she couldn't breathe.Had not taken anything during Covid and slept a lot.  Restarted antidepressant after Covid.  Missing some.  Still very lethargic and SOB related to Covid.  Intermittent loss of taste. Never took Abilify consistently since here.  Was taking Wellbutrin consistently not Buspar before Covid.  Pre-Covid had still been unmotivated with low energy and productivity.  Adderall would work for that.  Adderall compliance also varies. Enough sleep. Anhedonia not sad. Depression 5/10.  Anxiety  5/10.   Plan: Start Abilify 5 mg 1/2 tablet each morning for 1 week and if no benefit increase to  5 smg daily. Stop buspirone dT polypharmacy and compliance problems. Be more consistent with Wellbutrin otherwise will not work.  11/07/2019 appointment the following is noted: Abilify helped.  Depression started going away.  Anxiety better and handling things better and more assertive.  Doesn't want a change.   Tolerating it well. Sleep plenty.  Awakening on time generally. Adderall first thing in AM and helped to take it in a different pattern. Adderall is fine generally. Still having ADD sx even with the meds.  Needs it mostly.  She is no longer having elevated heart rate.  She likes the short acting because she can control the time and duration better than with slow release. Plan: No med changes  12/01/2019 phone call from patient with the following noted: She is on Wellbutrin, Adderall, and Abilify 5 mg was last med added which helped her depression.  She has taken buspirone in the past but had difficulty with compliance because of twice daily dosing. She wants something for anxiety.  We could consider starting an SSRI but historically she has wanted to avoid those.  We want to avoid benzodiazepines because of taking the Adderall.  Therefore the simplest solution would be an increase in Abilify.  Have her increase Abilify to 1-1/2 of the 5 mg tablets or 7.5 mg daily.  We can go as high as 10 mg if needed.  She should see improvement within a couple of weeks. . 04/27/2020 appointment with the following noted: Adding 5 mg to total of 10 mg Abilify helped. Changed jobs to help deal with the stress and it's better now after leaving that terrrible job.   Increase Abilify to 10 mg helped additionally with depression and anxiety. Asks about the increase Adderall to TID bc of  longer hours.   Plan: Abilify 10 mg daily was markedly helpful for depression and anxiety. Continue Wellbutrin XL 450 mg every morning Continue Adderall 30 mg twice daily or split 2 tablets up to TID..  08/18/20 appt noted: Doing well.  Occ forgets meds and feels bad.  Feels great. Not even stressed out as new job with some $ stress.  Handling stress much better.   Accidentally increased to 20 mg Abilify taking 2 of the 10 mg tablets.  Feels it if forgets the med.  No SE.  Sleepy if don't take med. Lost 30#.   Plan: Reduce Abilify from  20 to 15 mg daily was markedly helpful for depression and anxiety. Continue Wellbutrin XL 450 mg every morning Continue Adderall 30 mg twice daily or split 2 tablets up to TID..  02/09/2021 appointment with the following noted: Doing well.  Struggling with weight and hard to do anything about it. Knee pain from weight. Patient reports stable mood and mild depressed or irritable moods.  Patient denies any recent difficulty with anxiety.  Patient denies difficulty with sleep initiation or maintenance. Denies appetite disturbance.  Patient reports that energy and motivation have been good.  Patient denies any difficulty with concentration.  Patient denies any suicidal ideation. Benefit from Adderall. More consistent by use of pill box. Satisfied with meds. Plan: no med changes Abilify 15 mg daily was markedly helpful for depression and anxiety. Continue Wellbutrin XL 450 mg every morning Continue Adderall 30 mg twice daily or split 2 tablets up to TID..  08/24/21 appt noted: Can't get Ozempic or Wegovy.  Started exercise 5 days per week which has helped her feel better.  Not depressed.  Feels more alert. No job until August and it would be 2 years until then. Wt stable but not going down.  Had lost 30# while on Saxenda.   Sometimes forgets afternoon Adderall but needs it. Doing well with it. Disc the shortage. Patient reports stable mood and denies depressed or irritable moods.  Patient denies any recent difficulty with anxiety.  Patient denies difficulty with sleep initiation or maintenance. Denies appetite disturbance.  Patient reports that energy and motivation have been good.  Patient denies any difficulty with concentration.  Patient denies any suicidal ideation. No SE  Both D's on Adderall.    A little anxiety re: work.  Otherwise anxiety is manageable.  Past Psychiatric Medication Trials: Sertraline, Pristiq, venlafaxine, Wellbutrin 450, paroxetine, Viibryd helped too expensive,   Abilify 10 helped,   Adderall 60 , Vyvanse didn't like topiramate  She has been under our care since 2000  Review of Systems:  Review of Systems  Constitutional:  Negative for fatigue and unexpected weight change.  Neurological:  Negative for tremors and weakness.   Medications: I have reviewed the patient's current medications.  Current Outpatient Medications  Medication Sig Dispense Refill   celecoxib (CELEBREX) 200 MG capsule Take 200 mg by mouth daily.     fluticasone (FLONASE) 50 MCG/ACT nasal spray fluticasone propionate 50 mcg/actuation nasal spray,suspension  SPRAY 2 SPRAYS INTO EACH NOSTRIL EVERY DAY     hydrocortisone 2.5 % ointment hydrocortisone 2.5 % topical ointment  1 APPLICATION TO AFFECTED AREA TWICE A DAY EXTERNALLY 14 DAYS     amphetamine-dextroamphetamine (ADDERALL) 30 MG tablet Take 1 tablet by mouth 2 (two) times daily. 60 tablet 0   ARIPiprazole (ABILIFY) 15 MG tablet Take 1 tablet (15 mg total) by mouth daily. 90 tablet 1   buPROPion (WELLBUTRIN XL) 150  MG 24 hr tablet Take 3 tablets (450 mg total) by mouth daily. 270 tablet 1   Ibuprofen-Famotidine 800-26.6 MG TABS Duexis 800 mg-26.6 mg tablet  TAKE ONE TABLET BY MOUTH THREE TIMES DAILY (Patient not taking: Reported on 08/24/2021)     No current facility-administered medications for this visit.    Medication Side Effects: None  Allergies: No Known Allergies  History reviewed. No pertinent past medical history.  History reviewed. No pertinent family history.  Social History   Socioeconomic History   Marital status: Married    Spouse name: Not on file   Number of children: Not on file   Years of education: Not on file   Highest education level: Not on file  Occupational History   Not on file  Tobacco Use   Smoking status: Former   Smokeless tobacco: Never  Substance and Sexual Activity   Alcohol use: Not on file   Drug use: Not on file   Sexual activity: Not on file  Other Topics Concern    Not on file  Social History Narrative   Not on file   Social Determinants of Health   Financial Resource Strain: Not on file  Food Insecurity: Not on file  Transportation Needs: Not on file  Physical Activity: Not on file  Stress: Not on file  Social Connections: Not on file  Intimate Partner Violence: Not on file    Past Medical History, Surgical history, Social history, and Family history were reviewed and updated as appropriate.   Please see review of systems for further details on the patient's review from today.   Objective:   Physical Exam:  BP 132/86    Pulse 79   Physical Exam Neurological:     Mental Status: She is alert and oriented to person, place, and time.     Cranial Nerves: No dysarthria.  Psychiatric:        Attention and Perception: Attention normal. She does not perceive auditory hallucinations.        Mood and Affect: Mood is not anxious or depressed.        Speech: Speech normal. Speech is not slurred.        Behavior: Behavior is cooperative.        Thought Content: Thought content normal. Thought content is not paranoid or delusional. Thought content does not include homicidal or suicidal ideation. Thought content does not include suicidal plan.        Cognition and Memory: Cognition and memory normal.        Judgment: Judgment normal.     Comments: Insight good. Much better dep and anxiety    Lab Review:  No results found for: NA, K, CL, CO2, GLUCOSE, BUN, CREATININE, CALCIUM, PROT, ALBUMIN, AST, ALT, ALKPHOS, BILITOT, GFRNONAA, GFRAA  No results found for: WBC, RBC, HGB, HCT, PLT, MCV, MCH, MCHC, RDW, LYMPHSABS, MONOABS, EOSABS, BASOSABS  No results found for: POCLITH, LITHIUM   No results found for: PHENYTOIN, PHENOBARB, VALPROATE, CBMZ   .res Assessment: Plan:    Major depressive disorder, recurrent episode, moderate (HCC) - Plan: ARIPiprazole (ABILIFY) 15 MG tablet, buPROPion (WELLBUTRIN XL) 150 MG 24 hr tablet  Attention deficit  hyperactivity disorder (ADHD), combined type - Plan: amphetamine-dextroamphetamine (ADDERALL) 30 MG tablet  Generalized anxiety disorder - Plan: ARIPiprazole (ABILIFY) 15 MG tablet  Binge eating disorder - Plan: amphetamine-dextroamphetamine (ADDERALL) 30 MG tablet, buPROPion (WELLBUTRIN XL) 150 MG 24 hr tablet   At 11/06/2019 appointment she was struggling with moderate depression and  anxiety but also had recent panic attacks.  The Adderall was inconsistent.  We discussed various options and decided to potentiate Wellbutrin with the Abilify which has been very successful and she is tolerated it well.  Her anxiety and depression are under control now without recent panic but this required a further increase in Abilify June 2021 to 10 mg daily.  Since the fall 2021 she accidentally increased Abilify from 10 to 20 mg without side effects and feels even less anxious.  She has had no side effects.  Drop dose of Abilify back to 15 mg daily early 2022 with no loss of benefit. Option switch to Vraylar  She is at the usual maximum dosage of Adderall 60 mg daily.  .  We mentioned and discussed the option of long-acting stimulant such as Mydayis to cover greater part of the day in a more smooth manner but it is too expensive her for her to do that.    Discussed potential metabolic side effects associated with atypical antipsychotics, as well as potential risk for movement side effects. Advised pt to contact office if movement side effects occur.  No med changes. Abilify 15 mg daily was markedly helpful for depression and anxiety. Continue Wellbutrin XL 450 mg every morning Continue Adderall 30 mg twice daily or split 2 tablets up to TID.Marland Kitchen  Disc proper dosing of meds like Saxenda.  Disc this category of weight loss med in detail including side effects and alternatives within the category.  Suggest she talk with primary care about it first but if they cannot help her with it refer to [facility] and wellness to  discuss it.  Follow-up 6 months  Lynder Parents, MD, DFAPA   Please see After Visit Summary for patient specific instructions.  No future appointments.    No orders of the defined types were placed in this encounter.      -------------------------------

## 2021-09-16 ENCOUNTER — Telehealth: Payer: Self-pay | Admitting: Psychiatry

## 2021-09-16 NOTE — Telephone Encounter (Signed)
Alexa Perez called because she went to pick up her Adderall and because she has new insurance it requires a PA.  Pharmacy said they sent it, but I don't know if they had the new insurance.  It is now in our system.  Please start a PA if not already done ?

## 2021-09-19 NOTE — Telephone Encounter (Signed)
Prior approval for Amphetamine-dextroamphetamine 30 mg #60 received today 09/19/2021 with [employer] ?

## 2021-09-19 NOTE — Telephone Encounter (Signed)
Patient notified

## 2021-10-20 ENCOUNTER — Other Ambulatory Visit: Payer: Self-pay | Admitting: Psychiatry

## 2021-10-20 ENCOUNTER — Telehealth: Payer: Self-pay | Admitting: Psychiatry

## 2021-10-20 DIAGNOSIS — F902 Attention-deficit hyperactivity disorder, combined type: Secondary | ICD-10-CM

## 2021-10-20 DIAGNOSIS — F5081 Binge eating disorder: Secondary | ICD-10-CM

## 2021-10-20 MED ORDER — AMPHETAMINE-DEXTROAMPHETAMINE 30 MG PO TABS: 30.0000 mg | ORAL_TABLET | Freq: Two times a day (BID) | ORAL | 0 refills | Status: DC

## 2021-10-20 NOTE — Telephone Encounter (Signed)
Pt called at 3:27 pm about a refill on her adderall 30 mg. Pharmacy is [facility] on [address] does have it in stock. ?

## 2021-10-24 DIAGNOSIS — M1712 Unilateral primary osteoarthritis, left knee: Secondary | ICD-10-CM | POA: Diagnosis not present

## 2021-10-24 DIAGNOSIS — M1711 Unilateral primary osteoarthritis, right knee: Secondary | ICD-10-CM | POA: Diagnosis not present

## 2021-10-24 DIAGNOSIS — M25562 Pain in left knee: Secondary | ICD-10-CM | POA: Diagnosis not present

## 2021-10-24 DIAGNOSIS — M170 Bilateral primary osteoarthritis of knee: Secondary | ICD-10-CM | POA: Diagnosis not present

## 2021-11-21 ENCOUNTER — Telehealth: Payer: Self-pay | Admitting: Psychiatry

## 2021-11-21 ENCOUNTER — Other Ambulatory Visit: Payer: Self-pay

## 2021-11-21 DIAGNOSIS — F902 Attention-deficit hyperactivity disorder, combined type: Secondary | ICD-10-CM

## 2021-11-21 DIAGNOSIS — F5081 Binge eating disorder: Secondary | ICD-10-CM

## 2021-11-21 MED ORDER — AMPHETAMINE-DEXTROAMPHETAMINE 30 MG PO TABS: 30.0000 mg | ORAL_TABLET | Freq: Two times a day (BID) | ORAL | 0 refills | Status: DC

## 2021-11-21 NOTE — Telephone Encounter (Signed)
Pt LVM @1 :04p.  She would like refill of Adderall to [facility] [address]  It is in stock.  Next appt 9/12

## 2021-11-21 NOTE — Telephone Encounter (Signed)
Pended.

## 2021-12-21 ENCOUNTER — Telehealth: Payer: Self-pay | Admitting: Psychiatry

## 2021-12-21 ENCOUNTER — Other Ambulatory Visit: Payer: Self-pay

## 2021-12-21 DIAGNOSIS — F902 Attention-deficit hyperactivity disorder, combined type: Secondary | ICD-10-CM

## 2021-12-21 DIAGNOSIS — F5081 Binge eating disorder: Secondary | ICD-10-CM

## 2021-12-21 NOTE — Telephone Encounter (Signed)
Pended.

## 2021-12-21 NOTE — Telephone Encounter (Signed)
Pt lvm at 12 noon asking for a refill of her adderall. [facility] is [facility] located at [address]. They do have it in stock

## 2021-12-22 ENCOUNTER — Telehealth: Payer: Self-pay | Admitting: Psychiatry

## 2021-12-22 ENCOUNTER — Other Ambulatory Visit: Payer: Self-pay

## 2021-12-22 DIAGNOSIS — F902 Attention-deficit hyperactivity disorder, combined type: Secondary | ICD-10-CM

## 2021-12-22 DIAGNOSIS — F5081 Binge eating disorder: Secondary | ICD-10-CM

## 2021-12-22 MED ORDER — AMPHETAMINE-DEXTROAMPHETAMINE 30 MG PO TABS: 30.0000 mg | ORAL_TABLET | Freq: Two times a day (BID) | ORAL | 0 refills | Status: DC

## 2021-12-22 NOTE — Telephone Encounter (Signed)
Sent!

## 2021-12-22 NOTE — Telephone Encounter (Signed)
Pt called and said that her pharmacy and said that the pharmacy is out of stock of her adderall. Please  cancel and [facility] at [address]

## 2021-12-23 ENCOUNTER — Other Ambulatory Visit: Payer: Self-pay

## 2021-12-23 DIAGNOSIS — F5081 Binge eating disorder: Secondary | ICD-10-CM

## 2021-12-23 DIAGNOSIS — F902 Attention-deficit hyperactivity disorder, combined type: Secondary | ICD-10-CM

## 2021-12-23 MED ORDER — AMPHETAMINE-DEXTROAMPHETAMINE 30 MG PO TABS: 30.0000 mg | ORAL_TABLET | Freq: Two times a day (BID) | ORAL | 0 refills | Status: DC

## 2021-12-23 MED ORDER — AMPHETAMINE-DEXTROAMPHETAMINE 30 MG PO TABS
30.0000 mg | ORAL_TABLET | Freq: Two times a day (BID) | ORAL | 0 refills | Status: DC
Start: 2021-12-23 — End: 2022-04-18

## 2021-12-23 NOTE — Telephone Encounter (Signed)
Pt lvm at 6:57 am and said that she gave [place] the wrong pharmacy [facility] is [facility] [address]

## 2021-12-23 NOTE — Telephone Encounter (Signed)
Please refuse.it wont let me pend to new pharmacy

## 2022-01-05 ENCOUNTER — Other Ambulatory Visit: Payer: Self-pay | Admitting: Obstetrics & Gynecology

## 2022-01-05 DIAGNOSIS — Z1231 Encounter for screening mammogram for malignant neoplasm of breast: Secondary | ICD-10-CM

## 2022-01-06 ENCOUNTER — Other Ambulatory Visit: Payer: Self-pay | Admitting: Obstetrics and Gynecology

## 2022-01-06 ENCOUNTER — Ambulatory Visit
Admission: RE | Admit: 2022-01-06 | Discharge: 2022-01-06 | Disposition: A | Discharge status: Home / Self Care | Payer: 59 | Source: Ambulatory Visit | Attending: Obstetrics & Gynecology | Admitting: Obstetrics & Gynecology

## 2022-01-06 DIAGNOSIS — Z1231 Encounter for screening mammogram for malignant neoplasm of breast: Secondary | ICD-10-CM | POA: Diagnosis not present

## 2022-01-19 ENCOUNTER — Telehealth: Payer: Self-pay | Admitting: Psychiatry

## 2022-01-19 NOTE — Telephone Encounter (Signed)
Filled 7/7 due 8/4

## 2022-01-19 NOTE — Telephone Encounter (Signed)
Next visit is 04/18/22. Requesting refill on her Adderall called to:  [facility] [address] [place] - [address]  Phone:  [phone]  Fax:  [phone]   Latamara checked and they have it in stock.

## 2022-01-20 ENCOUNTER — Other Ambulatory Visit: Payer: Self-pay

## 2022-01-20 DIAGNOSIS — F5081 Binge eating disorder: Secondary | ICD-10-CM

## 2022-01-20 DIAGNOSIS — F902 Attention-deficit hyperactivity disorder, combined type: Secondary | ICD-10-CM

## 2022-01-20 MED ORDER — AMPHETAMINE-DEXTROAMPHETAMINE 30 MG PO TABS: 30.0000 mg | ORAL_TABLET | Freq: Two times a day (BID) | ORAL | 0 refills | Status: DC

## 2022-01-20 NOTE — Telephone Encounter (Signed)
Pended.

## 2022-02-17 ENCOUNTER — Other Ambulatory Visit: Payer: Self-pay

## 2022-02-17 ENCOUNTER — Telehealth: Payer: Self-pay | Admitting: Psychiatry

## 2022-02-17 DIAGNOSIS — F902 Attention-deficit hyperactivity disorder, combined type: Secondary | ICD-10-CM

## 2022-02-17 DIAGNOSIS — F5081 Binge eating disorder: Secondary | ICD-10-CM

## 2022-02-17 MED ORDER — AMPHETAMINE-DEXTROAMPHETAMINE 30 MG PO TABS
30.0000 mg | ORAL_TABLET | Freq: Two times a day (BID) | ORAL | 0 refills | Status: DC
Start: 2022-02-17 — End: 2022-04-18

## 2022-02-17 MED ORDER — AMPHETAMINE-DEXTROAMPHETAMINE 30 MG PO TABS: 30.0000 mg | ORAL_TABLET | Freq: Two times a day (BID) | ORAL | 0 refills | Status: DC

## 2022-02-17 NOTE — Telephone Encounter (Signed)
Pended.

## 2022-02-17 NOTE — Telephone Encounter (Signed)
PT lvm  @ 10:39a.  She would like refill of Adderall sent to [facility] [address]  It is in stock.   Next appt 10/31

## 2022-02-28 ENCOUNTER — Ambulatory Visit: Payer: 59 | Admitting: Psychiatry

## 2022-03-07 DIAGNOSIS — Z6841 Body Mass Index (BMI) 40.0 and over, adult: Secondary | ICD-10-CM | POA: Diagnosis not present

## 2022-03-07 DIAGNOSIS — R635 Abnormal weight gain: Secondary | ICD-10-CM | POA: Diagnosis not present

## 2022-03-16 ENCOUNTER — Other Ambulatory Visit: Payer: Self-pay | Admitting: Psychiatry

## 2022-03-16 DIAGNOSIS — F411 Generalized anxiety disorder: Secondary | ICD-10-CM

## 2022-03-16 DIAGNOSIS — E785 Hyperlipidemia, unspecified: Secondary | ICD-10-CM | POA: Diagnosis not present

## 2022-03-16 DIAGNOSIS — M1711 Unilateral primary osteoarthritis, right knee: Secondary | ICD-10-CM | POA: Diagnosis not present

## 2022-03-16 DIAGNOSIS — F5081 Binge eating disorder: Secondary | ICD-10-CM

## 2022-03-16 DIAGNOSIS — F331 Major depressive disorder, recurrent, moderate: Secondary | ICD-10-CM

## 2022-03-16 DIAGNOSIS — Z6841 Body Mass Index (BMI) 40.0 and over, adult: Secondary | ICD-10-CM | POA: Diagnosis not present

## 2022-03-16 DIAGNOSIS — E8881 Metabolic syndrome: Secondary | ICD-10-CM | POA: Diagnosis not present

## 2022-03-16 DIAGNOSIS — F909 Attention-deficit hyperactivity disorder, unspecified type: Secondary | ICD-10-CM | POA: Diagnosis not present

## 2022-03-30 DIAGNOSIS — Z6841 Body Mass Index (BMI) 40.0 and over, adult: Secondary | ICD-10-CM | POA: Diagnosis not present

## 2022-03-30 DIAGNOSIS — E88819 Insulin resistance, unspecified: Secondary | ICD-10-CM | POA: Diagnosis not present

## 2022-03-30 DIAGNOSIS — R69 Illness, unspecified: Secondary | ICD-10-CM | POA: Diagnosis not present

## 2022-03-30 DIAGNOSIS — E785 Hyperlipidemia, unspecified: Secondary | ICD-10-CM | POA: Diagnosis not present

## 2022-03-30 DIAGNOSIS — K5909 Other constipation: Secondary | ICD-10-CM | POA: Diagnosis not present

## 2022-04-06 DIAGNOSIS — Z713 Dietary counseling and surveillance: Secondary | ICD-10-CM | POA: Diagnosis not present

## 2022-04-06 DIAGNOSIS — E669 Obesity, unspecified: Secondary | ICD-10-CM | POA: Diagnosis not present

## 2022-04-13 ENCOUNTER — Other Ambulatory Visit: Payer: Self-pay | Admitting: Psychiatry

## 2022-04-13 DIAGNOSIS — F5081 Binge eating disorder: Secondary | ICD-10-CM

## 2022-04-13 DIAGNOSIS — F411 Generalized anxiety disorder: Secondary | ICD-10-CM

## 2022-04-13 DIAGNOSIS — F331 Major depressive disorder, recurrent, moderate: Secondary | ICD-10-CM

## 2022-04-17 ENCOUNTER — Other Ambulatory Visit: Payer: Self-pay | Admitting: Psychiatry

## 2022-04-17 DIAGNOSIS — Z713 Dietary counseling and surveillance: Secondary | ICD-10-CM | POA: Diagnosis not present

## 2022-04-17 DIAGNOSIS — F331 Major depressive disorder, recurrent, moderate: Secondary | ICD-10-CM

## 2022-04-17 DIAGNOSIS — E785 Hyperlipidemia, unspecified: Secondary | ICD-10-CM | POA: Diagnosis not present

## 2022-04-17 DIAGNOSIS — F50819 Binge eating disorder, unspecified: Secondary | ICD-10-CM

## 2022-04-17 DIAGNOSIS — E88819 Insulin resistance, unspecified: Secondary | ICD-10-CM | POA: Diagnosis not present

## 2022-04-17 DIAGNOSIS — F411 Generalized anxiety disorder: Secondary | ICD-10-CM

## 2022-04-17 DIAGNOSIS — F5081 Binge eating disorder: Secondary | ICD-10-CM

## 2022-04-17 DIAGNOSIS — E669 Obesity, unspecified: Secondary | ICD-10-CM | POA: Diagnosis not present

## 2022-04-17 NOTE — Telephone Encounter (Signed)
Pt called requesting  generic adderall Rx to [facility]. In stock. Apt 10/31

## 2022-04-17 NOTE — Telephone Encounter (Signed)
Has an appt with Dr. Cottle tomorrow 

## 2022-04-18 ENCOUNTER — Ambulatory Visit: Payer: 59 | Admitting: Psychiatry

## 2022-04-18 ENCOUNTER — Encounter: Payer: Self-pay | Admitting: Psychiatry

## 2022-04-18 VITALS — BP 132/82 | HR 89

## 2022-04-18 DIAGNOSIS — F331 Major depressive disorder, recurrent, moderate: Secondary | ICD-10-CM

## 2022-04-18 DIAGNOSIS — F902 Attention-deficit hyperactivity disorder, combined type: Secondary | ICD-10-CM | POA: Diagnosis not present

## 2022-04-18 DIAGNOSIS — F3341 Major depressive disorder, recurrent, in partial remission: Secondary | ICD-10-CM | POA: Diagnosis not present

## 2022-04-18 DIAGNOSIS — F5081 Binge eating disorder: Secondary | ICD-10-CM

## 2022-04-18 DIAGNOSIS — F411 Generalized anxiety disorder: Secondary | ICD-10-CM

## 2022-04-18 DIAGNOSIS — F418 Other specified anxiety disorders: Secondary | ICD-10-CM | POA: Diagnosis not present

## 2022-04-18 DIAGNOSIS — R69 Illness, unspecified: Secondary | ICD-10-CM | POA: Diagnosis not present

## 2022-04-18 MED ORDER — AMPHETAMINE-DEXTROAMPHETAMINE 30 MG PO TABS: 30.0000 mg | ORAL_TABLET | Freq: Two times a day (BID) | ORAL | 0 refills | Status: DC

## 2022-04-18 MED ORDER — PROPRANOLOL HCL 20 MG PO TABS: ORAL_TABLET | ORAL | 1 refills | Status: DC

## 2022-04-18 MED ORDER — ARIPIPRAZOLE 15 MG PO TABS: 15.0000 mg | ORAL_TABLET | Freq: Every day | ORAL | 1 refills | Status: DC

## 2022-04-18 MED ORDER — BUPROPION HCL ER (XL) 150 MG PO TB24: 450.0000 mg | ORAL_TABLET | Freq: Every day | ORAL | 1 refills | Status: DC

## 2022-04-18 NOTE — Progress Notes (Signed)
Alexa Perez [id number] Mar 13, 1956 66 y.o.    Subjective:   Patient ID:  Alexa Perez is a 66 y.o. (DOB Apr 15, 1956) female.  Chief Complaint:  Chief Complaint  Patient presents with   Follow-up   Depression   Anxiety   ADHD    Depression        Associated symptoms include no fatigue.  Past medical history includes anxiety.   Anxiety     Dema Alexa Perez presents to the office today for follow-up of ADHD, major depression, generalized anxiety disorder, and binge eating disorder.  seen April 2020.  We elected to retry the buspirone at low dose 5 mg twice a day for 1 week and then 10 mg twice a day for anxiety. She continued on Wellbutrin XL 450.  She had previously stopped Viibryd due to cost.  seen October 2020.  She had residual depression and it was suggested she return to Abilify which is previously been helpful.  She was to follow-up in 3 months but it has been about 6 months.  seen 09/12/19 with the followining noted: Had massive anxiety with Panic with Covid early Feb and felt she couldn't breathe.Had not taken anything during Covid and slept a lot.  Restarted antidepressant after Covid.  Missing some.  Still very lethargic and SOB related to Covid.  Intermittent loss of taste. Never took Abilify consistently since here.  Was taking Wellbutrin consistently not Buspar before Covid.  Pre-Covid had still been unmotivated with low energy and productivity.  Adderall would work for that.  Adderall compliance also varies. Enough sleep. Anhedonia not sad. Depression 5/10.  Anxiety  5/10.   Plan: Start Abilify 5 mg 1/2 tablet each morning for 1 week and if no benefit increase to  5 smg daily. Stop buspirone dT polypharmacy and compliance problems. Be more consistent with Wellbutrin otherwise will not work.  11/07/2019 appointment the following is noted: Abilify helped.  Depression started going away.  Anxiety better and handling things better and more assertive.  Doesn't want a  change.  Tolerating it well. Sleep plenty.  Awakening on time generally. Adderall first thing in AM and helped to take it in a different pattern. Adderall is fine generally. Still having ADD sx even with the meds.  Needs it mostly.  She is no longer having elevated heart rate.  She likes the short acting because she can control the time and duration better than with slow release. Plan: No med changes  12/01/2019 phone call from patient with the following noted: She is on Wellbutrin, Adderall, and Abilify 5 mg was last med added which helped her depression.  She has taken buspirone in the past but had difficulty with compliance because of twice daily dosing. She wants something for anxiety.  We could consider starting an SSRI but historically she has wanted to avoid those.  We want to avoid benzodiazepines because of taking the Adderall.  Therefore the simplest solution would be an increase in Abilify.  Have her increase Abilify to 1-1/2 of the 5 mg tablets or 7.5 mg daily.  We can go as high as 10 mg if needed.  She should see improvement within a couple of weeks. . 04/27/2020 appointment with the following noted: Adding 5 mg to total of 10 mg Abilify helped. Changed jobs to help deal with the stress and it's better now after leaving that terrrible job.   Increase Abilify to 10 mg helped additionally with depression and anxiety. Asks about the increase Adderall  to TID bc of longer hours.   Plan: Abilify 10 mg daily was markedly helpful for depression and anxiety. Continue Wellbutrin XL 450 mg every morning Continue Adderall 30 mg twice daily or split 2 tablets up to TID..  08/18/20 appt noted: Doing well.  Occ forgets meds and feels bad.  Feels great. Not even stressed out as new job with some $ stress.  Handling stress much better.   Accidentally increased to 20 mg Abilify taking 2 of the 10 mg tablets.  Feels it if forgets the med.  No SE.  Sleepy if don't take med. Lost 30#.   Plan: Reduce  Abilify from 20 to 15 mg daily was markedly helpful for depression and anxiety. Continue Wellbutrin XL 450 mg every morning Continue Adderall 30 mg twice daily or split 2 tablets up to TID..  02/09/2021 appointment with the following noted: Doing well.  Struggling with weight and hard to do anything about it. Knee pain from weight. Patient reports stable mood and mild depressed or irritable moods.  Patient denies any recent difficulty with anxiety.  Patient denies difficulty with sleep initiation or maintenance. Denies appetite disturbance.  Patient reports that energy and motivation have been good.  Patient denies any difficulty with concentration.  Patient denies any suicidal ideation. Benefit from Adderall. More consistent by use of pill box. Satisfied with meds. Plan: no med changes Abilify 15 mg daily was markedly helpful for depression and anxiety. Continue Wellbutrin XL 450 mg every morning Continue Adderall 30 mg twice daily or split 2 tablets up to TID..  08/24/21 appt noted: Can't get Ozempic or Wegovy.  Started exercise 5 days per week which has helped her feel better.  Not depressed.  Feels more alert. No job until August and it would be 2 years until then. Wt stable but not going down.  Had lost 30# while on Saxenda.   Sometimes forgets afternoon Adderall but needs it. Doing well with it. Disc the shortage. Patient reports stable mood and denies depressed or irritable moods.  Patient denies any recent difficulty with anxiety.  Patient denies difficulty with sleep initiation or maintenance. Denies appetite disturbance.  Patient reports that energy and motivation have been good.  Patient denies any difficulty with concentration.  Patient denies any suicidal ideation. No SE Plan: No med changes. Abilify 15 mg daily was markedly helpful for depression and anxiety. Continue Wellbutrin XL 450 mg every morning Continue Adderall 30 mg twice daily or split 2 tablets up to  TID.Marland Kitchen  04/18/22 appt noted: Doing good.  Wonders about chronic breathholding and anxiety.  Happens when gets nervous.  Does not feel SOB but it's more the sound she makes when in meetings.   Routinely anxious in meetings.  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  Both D's on Adderall.    A little anxiety re: work.  Otherwise anxiety is manageable.  Past Psychiatric Medication Trials: Sertraline, Pristiq, venlafaxine, Wellbutrin 450, paroxetine, Viibryd helped too expensive,  Abilify 10 helped,   Adderall 60 , Vyvanse didn't like topiramate  She has been under our care since 2000  Review of Systems:  Review of Systems  Constitutional:  Negative for fatigue.  Neurological:  Negative for tremors.    Medications: I have reviewed the patient's current medications.  Current Outpatient Medications  Medication Sig Dispense Refill   celecoxib (CELEBREX) 200 MG capsule Take 200 mg by mouth daily.     fluticasone (FLONASE) 50 MCG/ACT nasal spray fluticasone propionate  50 mcg/actuation nasal spray,suspension  SPRAY 2 SPRAYS INTO EACH NOSTRIL EVERY DAY     hydrocortisone 2.5 % ointment hydrocortisone 2.5 % topical ointment  1 APPLICATION TO AFFECTED AREA TWICE A DAY EXTERNALLY 14 DAYS     Ibuprofen-Famotidine 800-26.6 MG TABS      metFORMIN (GLUCOPHAGE) 500 MG tablet Take by mouth daily with breakfast.     propranolol (INDERAL) 20 MG tablet 1 or 2 tablets as needed daily for performance anxiety 60 tablet 1   [START ON 06/13/2022] amphetamine-dextroamphetamine (ADDERALL) 30 MG tablet Take 1 tablet by mouth 2 (two) times daily. 60 tablet 0   [START ON 05/16/2022] amphetamine-dextroamphetamine (ADDERALL) 30 MG tablet Take 1 tablet by mouth 2 (two) times daily. 60 tablet 0   amphetamine-dextroamphetamine (ADDERALL) 30 MG tablet Take 1 tablet by mouth 2 (two) times daily. 60 tablet 0   ARIPiprazole (ABILIFY) 15 MG tablet Take 1 tablet (15 mg total) by mouth daily. 90  tablet 1   buPROPion (WELLBUTRIN XL) 150 MG 24 hr tablet Take 3 tablets (450 mg total) by mouth daily. 270 tablet 1   No current facility-administered medications for this visit.    Medication Side Effects: None  Allergies: No Known Allergies  History reviewed. No pertinent past medical history.  Family History  Problem Relation Age of Onset   Breast cancer Neg Hx     Social History   Socioeconomic History   Marital status: Married    Spouse name: Not on file   Number of children: Not on file   Years of education: Not on file   Highest education level: Not on file  Occupational History   Not on file  Tobacco Use   Smoking status: Former   Smokeless tobacco: Never  Substance and Sexual Activity   Alcohol use: Not on file   Drug use: Not on file   Sexual activity: Not on file  Other Topics Concern   Not on file  Social History Narrative   Not on file   Social Determinants of Health   Financial Resource Strain: Not on file  Food Insecurity: Not on file  Transportation Needs: Not on file  Physical Activity: Not on file  Stress: Not on file  Social Connections: Not on file  Intimate Partner Violence: Not on file    Past Medical History, Surgical history, Social history, and Family history were reviewed and updated as appropriate.   Please see review of systems for further details on the patient's review from today.   Objective:   Physical Exam:  BP 132/82   Pulse 89   Physical Exam Neurological:     Mental Status: She is alert and oriented to person, place, and time.     Cranial Nerves: No dysarthria.  Psychiatric:        Attention and Perception: Attention normal. She does not perceive auditory hallucinations.        Mood and Affect: Mood is not anxious or depressed.        Speech: Speech normal. Speech is not slurred.        Behavior: Behavior is cooperative.        Thought Content: Thought content normal. Thought content is not paranoid or  delusional. Thought content does not include homicidal or suicidal ideation. Thought content does not include suicidal plan.        Cognition and Memory: Cognition and memory normal.        Judgment: Judgment normal.     Comments: Insight  good. Much better dep      Lab Review:  No results found for: "NA", "K", "CL", "CO2", "GLUCOSE", "BUN", "CREATININE", "CALCIUM", "PROT", "ALBUMIN", "AST", "ALT", "ALKPHOS", "BILITOT", "GFRNONAA", "GFRAA"  No results found for: "WBC", "RBC", "HGB", "HCT", "PLT", "MCV", "MCH", "MCHC", "RDW", "LYMPHSABS", "MONOABS", "EOSABS", "BASOSABS"  No results found for: "POCLITH", "LITHIUM"   No results found for: "PHENYTOIN", "PHENOBARB", "VALPROATE", "CBMZ"   .res Assessment: Plan:    Recurrent major depression in partial remission ([place])  Performance anxiety - Plan: propranolol (INDERAL) 20 MG tablet  Binge eating disorder - Plan: amphetamine-dextroamphetamine (ADDERALL) 30 MG tablet, amphetamine-dextroamphetamine (ADDERALL) 30 MG tablet, amphetamine-dextroamphetamine (ADDERALL) 30 MG tablet, buPROPion (WELLBUTRIN XL) 150 MG 24 hr tablet  Generalized anxiety disorder - Plan: ARIPiprazole (ABILIFY) 15 MG tablet  Attention deficit hyperactivity disorder (ADHD), combined type - Plan: amphetamine-dextroamphetamine (ADDERALL) 30 MG tablet, amphetamine-dextroamphetamine (ADDERALL) 30 MG tablet, amphetamine-dextroamphetamine (ADDERALL) 30 MG tablet  Major depressive disorder, recurrent episode, moderate (HCC) - Plan: ARIPiprazole (ABILIFY) 15 MG tablet, buPROPion (WELLBUTRIN XL) 150 MG 24 hr tablet   At 11/06/2019 appointment she was struggling with moderate depression and anxiety but also had recent panic attacks.   We discussed various options and decided to potentiate Wellbutrin with the Abilify which has been very successful and she is tolerated it well.  Her anxiety and depression are under control now without recent panic but this required a further increase in  Abilify June 2021 to 10 mg daily.  Since the fall 2021 she accidentally increased Abilify from 10 to 20 mg without side effects and feels even less anxious.  She has had no side effects.  Dropped dose of Abilify back to 15 mg daily early 2022 with no loss of benefit. Option switch to Vraylar  She is at the usual maximum dosage of Adderall 60 mg daily.  .  We mentioned and discussed the option of long-acting stimulant such as Mydayis to cover greater part of the day in a more smooth manner but it is too expensive her for her to do that.    Discussed potential benefits, risks, and side effects of stimulants with patient to include increased heart rate, palpitations, insomnia, increased anxiety, increased irritability, or decreased appetite.  Instructed patient to contact office if experiencing any significant tolerability issues.  Discussed potential metabolic side effects associated with atypical antipsychotics, as well as potential risk for movement side effects. Advised pt to contact office if movement side effects occur.  No med changes. Abilify 15 mg daily was markedly helpful for depression and anxiety. Continue Wellbutrin XL 450 mg every morning Continue Adderall 30 mg twice daily or split 2 tablets up to TID.Marland Kitchen  Propranolol prn performance anxiety in classes and meetings 20-40 mg prn  Follow-up 6 months  Lynder Parents, MD, DFAPA   Please see After Visit Summary for patient specific instructions.  No future appointments.    No orders of the defined types were placed in this encounter.      -------------------------------

## 2022-04-28 DIAGNOSIS — M1712 Unilateral primary osteoarthritis, left knee: Secondary | ICD-10-CM | POA: Diagnosis not present

## 2022-04-28 DIAGNOSIS — M1711 Unilateral primary osteoarthritis, right knee: Secondary | ICD-10-CM | POA: Diagnosis not present

## 2022-04-28 DIAGNOSIS — M25562 Pain in left knee: Secondary | ICD-10-CM | POA: Diagnosis not present

## 2022-05-10 ENCOUNTER — Other Ambulatory Visit: Payer: Self-pay | Admitting: Psychiatry

## 2022-05-10 DIAGNOSIS — F418 Other specified anxiety disorders: Secondary | ICD-10-CM

## 2022-05-17 ENCOUNTER — Telehealth: Payer: Self-pay | Admitting: Psychiatry

## 2022-05-17 NOTE — Telephone Encounter (Unsigned)
Pt LVM @ 10:28a.  She would like refill of Adderall sent to [facility] [address]     Next appt 5/1

## 2022-05-18 NOTE — Telephone Encounter (Signed)
She has a script at the pharmacy.

## 2022-05-24 DIAGNOSIS — E7800 Pure hypercholesterolemia, unspecified: Secondary | ICD-10-CM | POA: Diagnosis not present

## 2022-05-24 DIAGNOSIS — Z1212 Encounter for screening for malignant neoplasm of rectum: Secondary | ICD-10-CM | POA: Diagnosis not present

## 2022-05-24 DIAGNOSIS — R7301 Impaired fasting glucose: Secondary | ICD-10-CM | POA: Diagnosis not present

## 2022-05-24 DIAGNOSIS — I10 Essential (primary) hypertension: Secondary | ICD-10-CM | POA: Diagnosis not present

## 2022-05-31 DIAGNOSIS — R82998 Other abnormal findings in urine: Secondary | ICD-10-CM | POA: Diagnosis not present

## 2022-05-31 DIAGNOSIS — I10 Essential (primary) hypertension: Secondary | ICD-10-CM | POA: Diagnosis not present

## 2022-06-13 ENCOUNTER — Other Ambulatory Visit: Payer: Self-pay | Admitting: Psychiatry

## 2022-06-13 DIAGNOSIS — F331 Major depressive disorder, recurrent, moderate: Secondary | ICD-10-CM

## 2022-06-13 DIAGNOSIS — F5081 Binge eating disorder: Secondary | ICD-10-CM

## 2022-06-13 DIAGNOSIS — F411 Generalized anxiety disorder: Secondary | ICD-10-CM

## 2022-06-16 ENCOUNTER — Other Ambulatory Visit: Payer: Self-pay

## 2022-06-16 ENCOUNTER — Telehealth: Payer: Self-pay | Admitting: Psychiatry

## 2022-06-16 DIAGNOSIS — F5081 Binge eating disorder: Secondary | ICD-10-CM

## 2022-06-16 DIAGNOSIS — F902 Attention-deficit hyperactivity disorder, combined type: Secondary | ICD-10-CM

## 2022-06-16 NOTE — Telephone Encounter (Signed)
Pended.

## 2022-06-16 NOTE — Telephone Encounter (Signed)
Next visit is 10/17/21. Requesting refill on Adderall 30 mg called to:  [id number] [place], [place] - [address]   Phone: [phone]  Fax: [phone]    Per patient it is in stock.

## 2022-06-18 MED ORDER — AMPHETAMINE-DEXTROAMPHETAMINE 30 MG PO TABS: 30.0000 mg | ORAL_TABLET | Freq: Two times a day (BID) | ORAL | 0 refills | Status: DC

## 2022-06-28 DIAGNOSIS — M170 Bilateral primary osteoarthritis of knee: Secondary | ICD-10-CM | POA: Diagnosis not present

## 2022-06-28 DIAGNOSIS — M25561 Pain in right knee: Secondary | ICD-10-CM | POA: Diagnosis not present

## 2022-06-28 DIAGNOSIS — M25562 Pain in left knee: Secondary | ICD-10-CM | POA: Diagnosis not present

## 2022-07-24 DIAGNOSIS — Z6837 Body mass index (BMI) 37.0-37.9, adult: Secondary | ICD-10-CM | POA: Diagnosis not present

## 2022-07-24 DIAGNOSIS — Z01419 Encounter for gynecological examination (general) (routine) without abnormal findings: Secondary | ICD-10-CM | POA: Diagnosis not present

## 2022-07-24 DIAGNOSIS — R8279 Other abnormal findings on microbiological examination of urine: Secondary | ICD-10-CM | POA: Diagnosis not present

## 2022-07-24 DIAGNOSIS — Z124 Encounter for screening for malignant neoplasm of cervix: Secondary | ICD-10-CM | POA: Diagnosis not present

## 2022-08-21 ENCOUNTER — Other Ambulatory Visit: Payer: Self-pay

## 2022-08-21 ENCOUNTER — Telehealth: Payer: Self-pay | Admitting: Psychiatry

## 2022-08-21 DIAGNOSIS — F5081 Binge eating disorder: Secondary | ICD-10-CM

## 2022-08-21 DIAGNOSIS — F902 Attention-deficit hyperactivity disorder, combined type: Secondary | ICD-10-CM

## 2022-08-21 DIAGNOSIS — Z13820 Encounter for screening for osteoporosis: Secondary | ICD-10-CM | POA: Diagnosis not present

## 2022-08-21 NOTE — Telephone Encounter (Signed)
Pended.

## 2022-08-21 NOTE — Telephone Encounter (Signed)
Pt requesting Rx for generic adderall. [facility] [address]  A[address] [facility]

## 2022-08-22 MED ORDER — AMPHETAMINE-DEXTROAMPHETAMINE 30 MG PO TABS: 30.0000 mg | ORAL_TABLET | Freq: Two times a day (BID) | ORAL | 0 refills | Status: DC

## 2022-09-11 DIAGNOSIS — M170 Bilateral primary osteoarthritis of knee: Secondary | ICD-10-CM | POA: Diagnosis not present

## 2022-09-22 ENCOUNTER — Telehealth: Payer: Self-pay | Admitting: Psychiatry

## 2022-09-22 ENCOUNTER — Other Ambulatory Visit: Payer: Self-pay | Admitting: Psychiatry

## 2022-09-22 DIAGNOSIS — F902 Attention-deficit hyperactivity disorder, combined type: Secondary | ICD-10-CM

## 2022-09-22 DIAGNOSIS — F5081 Binge eating disorder: Secondary | ICD-10-CM

## 2022-09-22 MED ORDER — AMPHETAMINE-DEXTROAMPHETAMINE 30 MG PO TABS: 30.0000 mg | ORAL_TABLET | Freq: Two times a day (BID) | ORAL | 0 refills | Status: DC

## 2022-09-22 NOTE — Telephone Encounter (Signed)
Pt called at 3:45p. She would like refill of Adderall to [facility] [address]. It is available there.  Next appt 5/1

## 2022-10-03 ENCOUNTER — Telehealth: Payer: Self-pay | Admitting: Psychiatry

## 2022-10-03 NOTE — Telephone Encounter (Signed)
[facility]DERALL  tab Call [phone]

## 2022-10-03 NOTE — Telephone Encounter (Signed)
[facility] [employer] approved Adderall   from 10/03/22-10/03/23

## 2022-10-03 NOTE — Telephone Encounter (Signed)
Prior Authorization Amphetamine-Dextroamphetamine  tablets #30/30 [employer]  Approved

## 2022-10-18 ENCOUNTER — Ambulatory Visit: Payer: 59 | Admitting: Psychiatry

## 2022-10-18 ENCOUNTER — Encounter: Payer: Self-pay | Admitting: Psychiatry

## 2022-10-18 DIAGNOSIS — F3341 Major depressive disorder, recurrent, in partial remission: Secondary | ICD-10-CM | POA: Diagnosis not present

## 2022-10-18 DIAGNOSIS — F418 Other specified anxiety disorders: Secondary | ICD-10-CM | POA: Diagnosis not present

## 2022-10-18 DIAGNOSIS — F411 Generalized anxiety disorder: Secondary | ICD-10-CM | POA: Diagnosis not present

## 2022-10-18 DIAGNOSIS — F5081 Binge eating disorder: Secondary | ICD-10-CM | POA: Diagnosis not present

## 2022-10-18 DIAGNOSIS — F50819 Binge eating disorder, unspecified: Secondary | ICD-10-CM

## 2022-10-18 DIAGNOSIS — F902 Attention-deficit hyperactivity disorder, combined type: Secondary | ICD-10-CM

## 2022-10-18 MED ORDER — AMPHETAMINE-DEXTROAMPHETAMINE 30 MG PO TABS
30.0000 mg | ORAL_TABLET | Freq: Two times a day (BID) | ORAL | 0 refills | Status: DC
Start: 2022-12-13 — End: 2023-03-01

## 2022-10-18 MED ORDER — AMPHETAMINE-DEXTROAMPHETAMINE 30 MG PO TABS: 30.0000 mg | ORAL_TABLET | Freq: Two times a day (BID) | ORAL | 0 refills | Status: DC

## 2022-10-18 MED ORDER — ARIPIPRAZOLE 15 MG PO TABS: 15.0000 mg | ORAL_TABLET | Freq: Every day | ORAL | 1 refills | Status: DC

## 2022-10-18 MED ORDER — BUPROPION HCL ER (XL) 150 MG PO TB24: 450.0000 mg | ORAL_TABLET | Freq: Every day | ORAL | 1 refills | Status: DC

## 2022-10-18 NOTE — Progress Notes (Signed)
Alexa Perez [id number] May 17, 1956 67 y.o.    Subjective:   Patient ID:  Alexa Perez is a 67 y.o. (DOB [birth year]) female.  Chief Complaint:  Chief Complaint  Patient presents with   Follow-up   ADD    Depression        Associated symptoms include no fatigue.  Past medical history includes anxiety.   Anxiety     Alexa Perez presents to the office today for follow-up of ADHD, major depression, generalized anxiety disorder, and binge eating disorder.  seen April 2020.  We elected to retry the buspirone at low dose 5 mg twice a day for 1 week and then 10 mg twice a day for anxiety. She continued on Wellbutrin XL 450.  She had previously stopped Viibryd due to cost.  seen October 2020.  She had residual depression and it was suggested she return to Abilify which is previously been helpful.  She was to follow-up in 3 months but it has been about 6 months.  seen 09/12/19 with the followining noted: Had massive anxiety with Panic with Covid early Feb and felt she couldn't breathe.Had not taken anything during Covid and slept a lot.  Restarted antidepressant after Covid.  Missing some.  Still very lethargic and SOB related to Covid.  Intermittent loss of taste. Never took Abilify consistently since here.  Was taking Wellbutrin consistently not Buspar before Covid.  Pre-Covid had still been unmotivated with low energy and productivity.  Adderall would work for that.  Adderall compliance also varies. Enough sleep. Anhedonia not sad. Depression 5/10.  Anxiety  5/10.   Plan: Start Abilify 5 mg 1/2 tablet each morning for 1 week and if no benefit increase to  5 smg daily. Stop buspirone dT polypharmacy and compliance problems. Be more consistent with Wellbutrin otherwise will not work.  11/07/2019 appointment the following is noted: Abilify helped.  Depression started going away.  Anxiety better and handling things better and more assertive.  Doesn't want a change.  Tolerating it  well. Sleep plenty.  Awakening on time generally. Adderall first thing in AM and helped to take it in a different pattern. Adderall is fine generally. Still having ADD sx even with the meds.  Needs it mostly.  She is no longer having elevated heart rate.  She likes the short acting because she can control the time and duration better than with slow release. Plan: No med changes  12/01/2019 phone call from patient with the following noted: She is on Wellbutrin, Adderall, and Abilify 5 mg was last med added which helped her depression.  She has taken buspirone in the past but had difficulty with compliance because of twice daily dosing. She wants something for anxiety.  We could consider starting an SSRI but historically she has wanted to avoid those.  We want to avoid benzodiazepines because of taking the Adderall.  Therefore the simplest solution would be an increase in Abilify.  Have her increase Abilify to 1-1/2 of the 5 mg tablets or 7.5 mg daily.  We can go as high as 10 mg if needed.  She should see improvement within a couple of weeks. . 04/27/2020 appointment with the following noted: Adding 5 mg to total of 10 mg Abilify helped. Changed jobs to help deal with the stress and it's better now after leaving that terrrible job.   Increase Abilify to 10 mg helped additionally with depression and anxiety. Asks about the increase Adderall to TID bc of longer hours.  Plan: Abilify 10 mg daily was markedly helpful for depression and anxiety. Continue Wellbutrin XL 450 mg every morning Continue Adderall 30 mg twice daily or split 2 tablets up to TID..  08/18/20 appt noted: Doing well.  Occ forgets meds and feels bad.  Feels great. Not even stressed out as new job with some $ stress.  Handling stress much better.   Accidentally increased to 20 mg Abilify taking 2 of the 10 mg tablets.  Feels it if forgets the med.  No SE.  Sleepy if don't take med. Lost 30#.   Plan: Reduce Abilify from 20 to 15 mg  daily was markedly helpful for depression and anxiety. Continue Wellbutrin XL 450 mg every morning Continue Adderall 30 mg twice daily or split 2 tablets up to TID..  02/09/2021 appointment with the following noted: Doing well.  Struggling with weight and hard to do anything about it. Knee pain from weight. Patient reports stable mood and mild depressed or irritable moods.  Patient denies any recent difficulty with anxiety.  Patient denies difficulty with sleep initiation or maintenance. Denies appetite disturbance.  Patient reports that energy and motivation have been good.  Patient denies any difficulty with concentration.  Patient denies any suicidal ideation. Benefit from Adderall. More consistent by use of pill box. Satisfied with meds. Plan: no med changes Abilify 15 mg daily was markedly helpful for depression and anxiety. Continue Wellbutrin XL 450 mg every morning Continue Adderall 30 mg twice daily or split 2 tablets up to TID..  08/24/21 appt noted: Can't get Ozempic or Wegovy.  Started exercise 5 days per week which has helped her feel better.  Not depressed.  Feels more alert. No job until August and it would be 2 years until then. Wt stable but not going down.  Had lost 30# while on Saxenda.   Sometimes forgets afternoon Adderall but needs it. Doing well with it. Disc the shortage. Patient reports stable mood and denies depressed or irritable moods.  Patient denies any recent difficulty with anxiety.  Patient denies difficulty with sleep initiation or maintenance. Denies appetite disturbance.  Patient reports that energy and motivation have been good.  Patient denies any difficulty with concentration.  Patient denies any suicidal ideation. No SE Plan: No med changes. Abilify 15 mg daily was markedly helpful for depression and anxiety. Continue Wellbutrin XL 450 mg every morning Continue Adderall 30 mg twice daily or split 2 tablets up to TID.Marland Kitchen  04/18/22 appt noted: Doing  good.  Wonders about chronic breathholding and anxiety.  Happens when gets nervous.  Does not feel SOB but it's more the sound she makes when in meetings.   Routinely anxious in meetings.  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 Plan no changes  10/18/22 appt noted: Continues meds.  Propranolol prn helps anxiety without SE Doing good.  Adderall works well for her.   Patient reports stable mood and denies depressed or irritable moods.  Patient denies any recent difficulty with anxiety.  Patient denies difficulty with sleep initiation or maintenance. Require a lot of sleep.  Change in the last couple of years.Denies appetite disturbance.  Patient reports that energy and motivation have been good.  Patient denies any difficulty with concentration.  Patient denies any suicidal ideation. Anxiety is ok except some social performance.   RTW and has to get up earlier.  Job is a little new.  Stressful sales job.    Both D's on Adderall.    A  little anxiety re: work.  Otherwise anxiety is manageable.  Past Psychiatric Medication Trials: Sertraline, Pristiq, venlafaxine, Wellbutrin 450, paroxetine, Viibryd helped too expensive,  Abilify 15 helped,   Adderall 60 , Vyvanse didn't like topiramate  She has been under our care since 2000  Review of Systems:  Review of Systems  Constitutional:  Negative for fatigue.  Neurological:  Negative for tremors and weakness.    Medications: I have reviewed the patient's current medications.  Current Outpatient Medications  Medication Sig Dispense Refill   amphetamine-dextroamphetamine (ADDERALL) 30 MG tablet Take 1 tablet by mouth 2 (two) times daily. 60 tablet 0   celecoxib (CELEBREX) 200 MG capsule Take 200 mg by mouth daily.     fluticasone (FLONASE) 50 MCG/ACT nasal spray fluticasone propionate 50 mcg/actuation nasal spray,suspension  SPRAY 2 SPRAYS INTO EACH NOSTRIL EVERY DAY     hydrocortisone 2.5 % ointment hydrocortisone 2.5  % topical ointment  1 APPLICATION TO AFFECTED AREA TWICE A DAY EXTERNALLY 14 DAYS     Ibuprofen-Famotidine 800-26.6 MG TABS      metFORMIN (GLUCOPHAGE) 500 MG tablet Take by mouth daily with breakfast.     propranolol (INDERAL) 20 MG tablet 1 OR 2 TABLETS AS NEEDED DAILY FOR PERFORMANCE ANXIETY 180 tablet 1   amphetamine-dextroamphetamine (ADDERALL) 30 MG tablet Take 1 tablet by mouth 2 (two) times daily. 60 tablet 0   [START ON 11/15/2022] amphetamine-dextroamphetamine (ADDERALL) 30 MG tablet Take 1 tablet by mouth 2 (two) times daily. 60 tablet 0   [START ON 12/13/2022] amphetamine-dextroamphetamine (ADDERALL) 30 MG tablet Take 1 tablet by mouth 2 (two) times daily. 60 tablet 0   ARIPiprazole (ABILIFY) 15 MG tablet Take 1 tablet (15 mg total) by mouth daily. 90 tablet 1   buPROPion (WELLBUTRIN XL) 150 MG 24 hr tablet Take 3 tablets (450 mg total) by mouth daily. 270 tablet 1   No current facility-administered medications for this visit.    Medication Side Effects: None  Allergies: No Known Allergies  History reviewed. No pertinent past medical history.  Family History  Problem Relation Age of Onset   Breast cancer Neg Hx     Social History   Socioeconomic History   Marital status: Divorced    Spouse name: Not on file   Number of children: Not on file   Years of education: Not on file   Highest education level: Not on file  Occupational History   Not on file  Tobacco Use   Smoking status: Former   Smokeless tobacco: Never  Substance and Sexual Activity   Alcohol use: Not on file   Drug use: Not on file   Sexual activity: Not on file  Other Topics Concern   Not on file  Social History Narrative   Not on file   Social Determinants of Health   Financial Resource Strain: Not on file  Food Insecurity: Not on file  Transportation Needs: Not on file  Physical Activity: Not on file  Stress: Not on file  Social Connections: Not on file  Intimate Partner Violence: Not on  file    Past Medical History, Surgical history, Social history, and Family history were reviewed and updated as appropriate.   Please see review of systems for further details on the patient's review from today.   Objective:   Physical Exam:  There were no vitals taken for this visit.  Physical Exam Neurological:     Mental Status: She is alert and oriented to person, place, and time.  Cranial Nerves: No dysarthria.  Psychiatric:        Attention and Perception: Attention normal. She does not perceive auditory hallucinations.        Mood and Affect: Mood is not anxious or depressed.        Speech: Speech normal. Speech is not slurred.        Behavior: Behavior is cooperative.        Thought Content: Thought content normal. Thought content is not paranoid or delusional. Thought content does not include homicidal or suicidal ideation. Thought content does not include suicidal plan.        Cognition and Memory: Cognition and memory normal.        Judgment: Judgment normal.     Comments: Insight good.      Lab Review:  No results found for: "NA", "K", "CL", "CO2", "GLUCOSE", "BUN", "CREATININE", "CALCIUM", "PROT", "ALBUMIN", "AST", "ALT", "ALKPHOS", "BILITOT", "GFRNONAA", "GFRAA"  No results found for: "WBC", "RBC", "HGB", "HCT", "PLT", "MCV", "MCH", "MCHC", "RDW", "LYMPHSABS", "MONOABS", "EOSABS", "BASOSABS"  No results found for: "POCLITH", "LITHIUM"   No results found for: "PHENYTOIN", "PHENOBARB", "VALPROATE", "CBMZ"   .res Assessment: Plan:    Recurrent major depression in partial remission (HCC)  Generalized anxiety disorder - Plan: ARIPiprazole (ABILIFY) 15 MG tablet  Attention deficit hyperactivity disorder (ADHD), combined type - Plan: amphetamine-dextroamphetamine (ADDERALL) 30 MG tablet, amphetamine-dextroamphetamine (ADDERALL) 30 MG tablet, amphetamine-dextroamphetamine (ADDERALL) 30 MG tablet  Binge eating disorder - Plan: amphetamine-dextroamphetamine  (ADDERALL) 30 MG tablet, amphetamine-dextroamphetamine (ADDERALL) 30 MG tablet, amphetamine-dextroamphetamine (ADDERALL) 30 MG tablet, buPROPion (WELLBUTRIN XL) 150 MG 24 hr tablet  Performance anxiety   At 11/06/2019 appointment she was struggling with moderate depression and anxiety but also had recent panic attacks.   We discussed various options and decided to potentiate Wellbutrin with the Abilify which has been very successful and she is tolerated it well.  Her anxiety and depression are under control now without recent panic but this required a further increase in Abilify June 2021 to 10 mg daily.  Since the fall 2021 she accidentally increased Abilify from 10 to 20 mg without side effects and feels even less anxious.  She has had no side effects.  Dropped dose of Abilify back to 15 mg daily early 2022 with no loss of benefit. Option switch to Vraylar  She is at the usual maximum dosage of Adderall 60 mg daily.  .  We mentioned and discussed the option of long-acting stimulant such as Mydayis to cover greater part of the day in a more smooth manner but it is too expensive her for her to do that.    Discussed potential benefits, risks, and side effects of stimulants with patient to include increased heart rate, palpitations, insomnia, increased anxiety, increased irritability, or decreased appetite.  Instructed patient to contact office if experiencing any significant tolerability issues.  Discussed potential metabolic side effects associated with atypical antipsychotics, as well as potential risk for movement side effects. Advised pt to contact office if movement side effects occur.  Would consider reduction Abilify trial but stress is too high with job. No med changes.   Abilify 15 mg daily was markedly helpful for depression and anxiety. Continue Wellbutrin XL 450 mg every morning Continue Adderall 30 mg twice daily or split 2 tablets up to TID.Marland Kitchen  Propranolol prn performance anxiety in  classes and meetings 20-40 mg prn  Follow-up 6 months  Meredith Staggers, MD, DFAPA   Please see After Visit Summary for patient specific instructions.  No future appointments.    No orders of the defined types were placed in this encounter.      -------------------------------

## 2022-11-20 ENCOUNTER — Other Ambulatory Visit: Payer: Self-pay | Admitting: Psychiatry

## 2022-11-20 DIAGNOSIS — F418 Other specified anxiety disorders: Secondary | ICD-10-CM

## 2022-12-06 DIAGNOSIS — M170 Bilateral primary osteoarthritis of knee: Secondary | ICD-10-CM | POA: Diagnosis not present

## 2023-01-11 DIAGNOSIS — Z1231 Encounter for screening mammogram for malignant neoplasm of breast: Secondary | ICD-10-CM | POA: Diagnosis not present

## 2023-01-25 ENCOUNTER — Telehealth: Payer: Self-pay | Admitting: Psychiatry

## 2023-01-25 NOTE — Telephone Encounter (Signed)
Pt called at 3pm to request Adderall 30mg  to be sent to [facility] on [address]  Apt 10/31

## 2023-01-26 ENCOUNTER — Other Ambulatory Visit: Payer: Self-pay

## 2023-01-26 DIAGNOSIS — F5081 Binge eating disorder: Secondary | ICD-10-CM

## 2023-01-26 DIAGNOSIS — F902 Attention-deficit hyperactivity disorder, combined type: Secondary | ICD-10-CM

## 2023-01-26 MED ORDER — AMPHETAMINE-DEXTROAMPHETAMINE 30 MG PO TABS
30.0000 mg | ORAL_TABLET | Freq: Two times a day (BID) | ORAL | 0 refills | Status: DC
Start: 2023-01-26 — End: 2023-04-19

## 2023-01-26 MED ORDER — AMPHETAMINE-DEXTROAMPHETAMINE 30 MG PO TABS
30.0000 mg | ORAL_TABLET | Freq: Two times a day (BID) | ORAL | 0 refills | Status: DC
Start: 2023-02-23 — End: 2023-04-19

## 2023-01-26 MED ORDER — AMPHETAMINE-DEXTROAMPHETAMINE 30 MG PO TABS
30.0000 mg | ORAL_TABLET | Freq: Two times a day (BID) | ORAL | 0 refills | Status: DC
Start: 2023-03-23 — End: 2023-03-01

## 2023-01-26 NOTE — Telephone Encounter (Signed)
Pended.

## 2023-03-01 ENCOUNTER — Other Ambulatory Visit: Payer: Self-pay

## 2023-03-01 ENCOUNTER — Telehealth: Payer: Self-pay | Admitting: Psychiatry

## 2023-03-01 DIAGNOSIS — F5081 Binge eating disorder: Secondary | ICD-10-CM

## 2023-03-01 DIAGNOSIS — F418 Other specified anxiety disorders: Secondary | ICD-10-CM

## 2023-03-01 DIAGNOSIS — F411 Generalized anxiety disorder: Secondary | ICD-10-CM

## 2023-03-01 DIAGNOSIS — F902 Attention-deficit hyperactivity disorder, combined type: Secondary | ICD-10-CM

## 2023-03-01 MED ORDER — PROPRANOLOL HCL 20 MG PO TABS
ORAL_TABLET | ORAL | 1 refills | Status: DC
Start: 2023-03-01 — End: 2024-04-16

## 2023-03-01 MED ORDER — BUPROPION HCL ER (XL) 150 MG PO TB24
450.0000 mg | ORAL_TABLET | Freq: Every day | ORAL | 0 refills | Status: DC
Start: 2023-03-01 — End: 2023-04-19

## 2023-03-01 MED ORDER — ARIPIPRAZOLE 15 MG PO TABS
15.0000 mg | ORAL_TABLET | Freq: Every day | ORAL | 0 refills | Status: DC
Start: 2023-03-01 — End: 2023-04-19

## 2023-03-01 NOTE — Telephone Encounter (Signed)
Pended/sent 

## 2023-03-01 NOTE — Telephone Encounter (Signed)
Patient has changed insurance and has Medicare and a supplement. She has to use a [facility] now. She is requesting that her Adderall RF and all the other meds that she takes get transferred over to the [facility] on [address] in [place]. [facility] cannot [facility] anymore.

## 2023-03-02 MED ORDER — AMPHETAMINE-DEXTROAMPHETAMINE 30 MG PO TABS: 30.0000 mg | ORAL_TABLET | Freq: Two times a day (BID) | ORAL | 0 refills | Status: DC

## 2023-03-02 MED ORDER — AMPHETAMINE-DEXTROAMPHETAMINE 30 MG PO TABS
30.0000 mg | ORAL_TABLET | Freq: Two times a day (BID) | ORAL | 0 refills | Status: DC
Start: 2023-03-29 — End: 2023-04-19

## 2023-03-08 ENCOUNTER — Other Ambulatory Visit: Payer: Self-pay | Admitting: Psychiatry

## 2023-03-08 DIAGNOSIS — F5081 Binge eating disorder: Secondary | ICD-10-CM

## 2023-03-20 ENCOUNTER — Telehealth: Payer: Self-pay | Admitting: Internal Medicine

## 2023-03-20 NOTE — Telephone Encounter (Signed)
Good Morning Dr Marina Goodell   Patient Preferred provider  We have received a referral for patient to have a colon procedure.   Patient was previously with Dr Kinnie Scales who has retired.   Please review and advise on scheduling   Thank you

## 2023-03-20 NOTE — Telephone Encounter (Signed)
Please place a copy of all colonoscopy reports and pathology reports previous, in my office inbox. After that task has been completed, you may send this message back to me regarding response.  Thanks. Dr. Marina Goodell

## 2023-03-22 ENCOUNTER — Encounter: Payer: Self-pay | Admitting: Internal Medicine

## 2023-03-22 NOTE — Telephone Encounter (Signed)
[facility] GI RECORDS REVIEW  This patient requested transfer to our practice.  I was provided a colonoscopy report for review, which I did today.  June 24, 2013.  Colonoscopy.  Dr. Kinnie Scales.  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  This 2015 examination revealed a 8 mm rectosigmoid polyp which was removed (no pathology identified) and a chronic posterior anal fissure.  Repeat colonoscopy in 5 years was recommended.  Based on the available information, if the patient has not had a colonoscopy in the past 5 years, she is due. If this is the case, okay to schedule direct colonoscopy in the [facility] "history of colon polyps", with routine previsit prior.  Alexa Perez. Eda Keys., M.D. [facility] Division of Gastroenterology

## 2023-03-22 NOTE — Telephone Encounter (Signed)
Patient schedule for direct procedure with pre visit

## 2023-03-22 NOTE — Telephone Encounter (Signed)
Patient records placed in mail box for review. Please advise after reviewing.   Thank you

## 2023-03-29 ENCOUNTER — Ambulatory Visit: Payer: Medicare Other | Admitting: Nurse Practitioner

## 2023-03-29 ENCOUNTER — Encounter: Payer: Self-pay | Admitting: Nurse Practitioner

## 2023-03-29 VITALS — BP 124/70 | HR 71 | Ht 63.0 in | Wt 227.0 lb

## 2023-03-29 DIAGNOSIS — K649 Unspecified hemorrhoids: Secondary | ICD-10-CM

## 2023-03-29 DIAGNOSIS — Z860100 Personal history of colon polyps, unspecified: Secondary | ICD-10-CM | POA: Diagnosis not present

## 2023-03-29 MED ORDER — NA SULFATE-K SULFATE-MG SULF 17.5-3.13-1.6 GM/177ML PO SOLN: 1.0000 | Freq: Once | ORAL | 0 refills | Status: AC

## 2023-03-29 MED ORDER — HYDROCORTISONE (PERIANAL) 2.5 % EX CREA: TOPICAL_CREAM | CUTANEOUS | 1 refills | Status: AC

## 2023-03-29 NOTE — Progress Notes (Signed)
Noted  

## 2023-03-29 NOTE — Patient Instructions (Signed)
We have sent the following medications to your pharmacy for you to pick up at your convenience: Anusol cream and Suprep.  You have been scheduled for a colonoscopy. Please follow written instructions given to you at your visit today.   Please pick up your prep supplies at the pharmacy within the next 1-3 days.  If you use inhalers (even only as needed), please bring them with you on the day of your procedure.  DO NOT TAKE 7 DAYS PRIOR TO TEST- Trulicity (dulaglutide) Ozempic, Wegovy (semaglutide) Mounjaro (tirzepatide) Bydureon Bcise (exanatide extended release)  DO NOT TAKE 1 DAY PRIOR TO YOUR TEST Rybelsus (semaglutide) Adlyxin (lixisenatide) Victoza (liraglutide) Byetta (exanatide) ________________________________________________________________________  _______________________________________________________  If your blood pressure at your visit was 140/90 or greater, please contact your primary care physician to follow up on this.  _______________________________________________________  If you are age 47 or older, your body mass index should be between 23-30. Your Body mass index is 40.21 kg/m. If this is out of the aforementioned range listed, please consider follow up with your Primary Care Provider.  If you are age 32 or younger, your body mass index should be between 19-25. Your Body mass index is 40.21 kg/m. If this is out of the aformentioned range listed, please consider follow up with your Primary Care Provider.   ________________________________________________________  The [facility] providers would like to encourage you to use [facility] to communicate with providers for non-urgent requests or questions.  Due to long hold times on the telephone, sending your provider a message by [facility] may be a faster and more efficient way to get a response.  Please allow 48 business hours for a response.  Please remember that this is for non-urgent requests.   _______________________________________________________

## 2023-03-29 NOTE — Progress Notes (Signed)
ASSESSMENT & PLAN   67 y.o. yo female, new to the practice.  She has a history of colon polyps, anxiety, ADHD, obesity, insulin resistance. She was previously followed by Dr. Kinnie Scales  She requested to transfer to our practice per phone call 03/20/2023.  Dr. Marina Goodell reviewed her previous colonoscopy report  History of adenomatous colon polyps .  Had a TVA ~ 2020. On follow up colonoscopy in 2015 and 8 mm polyps was removed. No pathology identified.  --She is already scheduled for a surveillance colonoscopy with Dr Marina Goodell in December. The risks and benefits of colonoscopy with possible polypectomy / biopsies were discussed and the patient agrees to proceed.   Thrombosed external hemorrhoid.  She came in today concerned that it was a polyp --Anusol cream per rectum and perianally Q HS x 10 days --Reports normal BM but discussed started a fiber supplement and she is agreeable.   See [facility] for any additional medical history   HPI   Chief complaint : ? Anal polyp  Alexa Perez has a history of colon polyps, previously followed by Dr. Kinnie Scales.  She is now scheduled for surveillance colonoscopy with Dr. Marina Goodell in December.  She made this appointment out of concern for a polyp protruding from rectum.  She reports normal bowel movements.  No rectal bleeding.  She does have some rectal pressure with bowel movements. No family history of colon cancer.  She is nervous be because she is overdue for her surveillance colonoscopy. She feels fine    Previous GI Studies   **May not be a complete list of studies  Colonoscopy. Dr. Kinnie Scales. 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 This 2015 examination revealed a 8 mm rectosigmoid polyp which was removed (no pathology identified) and a chronic posterior anal fissure. Repeat colonoscopy in 5 years was recommended.    Past Medical History:  Diagnosis Date   ADHD    Depression    Dyslipidemia     Insulin resistance    OA (osteoarthritis)    Obesity    No past surgical history on file. Family History  Problem Relation Age of Onset   Breast cancer Neg Hx    Social History   Tobacco Use   Smoking status: Former   Smokeless tobacco: Never   Current Outpatient Medications  Medication Sig Dispense Refill   amphetamine-dextroamphetamine (ADDERALL) 30 MG tablet Take 1 tablet by mouth 2 (two) times daily. 60 tablet 0   amphetamine-dextroamphetamine (ADDERALL) 30 MG tablet Take 1 tablet by mouth 2 (two) times daily. 60 tablet 0   amphetamine-dextroamphetamine (ADDERALL) 30 MG tablet Take 1 tablet by mouth 2 (two) times daily. 60 tablet 0   amphetamine-dextroamphetamine (ADDERALL) 30 MG tablet Take 1 tablet by mouth 2 (two) times daily. 60 tablet 0   ARIPiprazole (ABILIFY) 15 MG tablet Take 1 tablet (15 mg total) by mouth daily. 90 tablet 0   buPROPion (WELLBUTRIN XL) 150 MG 24 hr tablet Take 3 tablets (450 mg total) by mouth daily. 270 tablet 0   celecoxib (CELEBREX) 200 MG capsule Take 200 mg by mouth daily.     fluticasone (FLONASE) 50 MCG/ACT nasal spray fluticasone propionate 50 mcg/actuation nasal spray,suspension  SPRAY 2 SPRAYS INTO EACH NOSTRIL EVERY DAY     hydrocortisone 2.5 % ointment hydrocortisone 2.5 % topical ointment  1 APPLICATION TO AFFECTED AREA TWICE A DAY EXTERNALLY 14 DAYS     Ibuprofen-Famotidine 800-26.6 MG TABS  metFORMIN (GLUCOPHAGE) 500 MG tablet Take by mouth daily with breakfast.     propranolol (INDERAL) 20 MG tablet 1 or 2 tablets as needed daily for performance anxiety 60 tablet 1   No current facility-administered medications for this visit.   No Known Allergies   Review of Systems: All systems reviewed and negative except where noted in HPI.   Wt Readings from Last 3 Encounters:  No data found for Wt    Physical Exam:  BP 124/70   Pulse 71   Ht 5\' 3"  (1.6 m)   Wt 227 lb (103 kg)   BMI 40.21 kg/m  Constitutional:  Pleasant,  generally well appearing female in no acute distress. Psychiatric:  Normal mood and affect. Behavior is normal. EENT: Pupils normal.  Conjunctivae are normal. No scleral icterus. Neck supple.  Cardiovascular: Normal rate, regular rhythm.  Pulmonary/chest: Effort normal and breath sounds normal. No wheezing, rales or rhonchi. Rectal: flesh colored thrombosed external hemorrhoid on left side Abdominal: Soft, nondistended, nontender. Bowel sounds active throughout. There are no masses palpable. No hepatomegaly. Neurological: Alert and oriented to person place and time.   Willette Cluster, NP  03/29/2023, 8:18 AM

## 2023-04-02 ENCOUNTER — Telehealth: Payer: Self-pay | Admitting: Psychiatry

## 2023-04-02 NOTE — Telephone Encounter (Signed)
Patient already has a RF at requested pharmacy. Notified her.

## 2023-04-02 NOTE — Telephone Encounter (Signed)
Patient Alexa Perez at 11:51 with refill request for Adderall 30mg . She also updated pharmacy and would like prescriptions to be sent to [facility] [address] [place], [place] [facility]: [phone] Appt 10/31

## 2023-04-11 ENCOUNTER — Other Ambulatory Visit: Payer: Self-pay | Admitting: Psychiatry

## 2023-04-11 DIAGNOSIS — F418 Other specified anxiety disorders: Secondary | ICD-10-CM

## 2023-04-11 NOTE — Telephone Encounter (Signed)
RF INAPPROPRIATE LF 10/14

## 2023-04-19 ENCOUNTER — Encounter: Payer: Self-pay | Admitting: Psychiatry

## 2023-04-19 ENCOUNTER — Ambulatory Visit (INDEPENDENT_AMBULATORY_CARE_PROVIDER_SITE_OTHER): Payer: Medicare Other | Admitting: Psychiatry

## 2023-04-19 DIAGNOSIS — F411 Generalized anxiety disorder: Secondary | ICD-10-CM | POA: Diagnosis not present

## 2023-04-19 DIAGNOSIS — F3341 Major depressive disorder, recurrent, in partial remission: Secondary | ICD-10-CM

## 2023-04-19 DIAGNOSIS — F902 Attention-deficit hyperactivity disorder, combined type: Secondary | ICD-10-CM

## 2023-04-19 DIAGNOSIS — F50819 Binge eating disorder, unspecified: Secondary | ICD-10-CM | POA: Diagnosis not present

## 2023-04-19 MED ORDER — AMPHETAMINE-DEXTROAMPHETAMINE 30 MG PO TABS: 30.0000 mg | ORAL_TABLET | Freq: Two times a day (BID) | ORAL | 0 refills | Status: DC

## 2023-04-19 MED ORDER — ARIPIPRAZOLE 15 MG PO TABS: 15.0000 mg | ORAL_TABLET | Freq: Every day | ORAL | 2 refills | Status: DC

## 2023-04-19 MED ORDER — BUPROPION HCL ER (XL) 150 MG PO TB24: 450.0000 mg | ORAL_TABLET | Freq: Every day | ORAL | 2 refills | Status: DC

## 2023-04-19 NOTE — Progress Notes (Signed)
Alexa Perez [id number] Nov 11, 1955 67 y.o.    Subjective:   Patient ID:  Alexa Perez is a 67 y.o. (DOB June 05, 1956) female.  Chief Complaint:  Chief Complaint  Patient presents with   Follow-up   ADD    Depression        Associated symptoms include no fatigue.  Past medical history includes anxiety.   Anxiety     Alexa Perez presents to the office today for follow-up of ADHD, major depression, generalized anxiety disorder, and binge eating disorder.  seen April 2020.  We elected to retry the buspirone at low dose 5 mg twice a day for 1 week and then 10 mg twice a day for anxiety. She continued on Wellbutrin XL 450.  She had previously stopped Viibryd due to cost.  seen October 2020.  She had residual depression and it was suggested she return to Abilify which is previously been helpful.  She was to follow-up in 3 months but it has been about 6 months.  seen 09/12/19 with the followining noted: Had massive anxiety with Panic with Covid early Feb and felt she couldn't breathe.Had not taken anything during Covid and slept a lot.  Restarted antidepressant after Covid.  Missing some.  Still very lethargic and SOB related to Covid.  Intermittent loss of taste. Never took Abilify consistently since here.  Was taking Wellbutrin consistently not Buspar before Covid.  Pre-Covid had still been unmotivated with low energy and productivity.  Adderall would work for that.  Adderall compliance also varies. Enough sleep. Anhedonia not sad. Depression 5/10.  Anxiety  5/10.   Plan: Start Abilify 5 mg 1/2 tablet each morning for 1 week and if no benefit increase to  5 smg daily. Stop buspirone dT polypharmacy and compliance problems. Be more consistent with Wellbutrin otherwise will not work.  11/07/2019 appointment the following is noted: Abilify helped.  Depression started going away.  Anxiety better and handling things better and more assertive.  Doesn't want a change.  Tolerating it  well. Sleep plenty.  Awakening on time generally. Adderall first thing in AM and helped to take it in a different pattern. Adderall is fine generally. Still having ADD sx even with the meds.  Needs it mostly.  She is no longer having elevated heart rate.  She likes the short acting because she can control the time and duration better than with slow release. Plan: No med changes  12/01/2019 phone call from patient with the following noted: She is on Wellbutrin, Adderall, and Abilify 5 mg was last med added which helped her depression.  She has taken buspirone in the past but had difficulty with compliance because of twice daily dosing. She wants something for anxiety.  We could consider starting an SSRI but historically she has wanted to avoid those.  We want to avoid benzodiazepines because of taking the Adderall.  Therefore the simplest solution would be an increase in Abilify.  Have her increase Abilify to 1-1/2 of the 5 mg tablets or 7.5 mg daily.  We can go as high as 10 mg if needed.  She should see improvement within a couple of weeks. . 04/27/2020 appointment with the following noted: Adding 5 mg to total of 10 mg Abilify helped. Changed jobs to help deal with the stress and it's better now after leaving that terrrible job.   Increase Abilify to 10 mg helped additionally with depression and anxiety. Asks about the increase Adderall to TID bc of longer hours.  Plan: Abilify 10 mg daily was markedly helpful for depression and anxiety. Continue Wellbutrin XL 450 mg every morning Continue Adderall 30 mg twice daily or split 2 tablets up to TID..  08/18/20 appt noted: Doing well.  Occ forgets meds and feels bad.  Feels great. Not even stressed out as new job with some $ stress.  Handling stress much better.   Accidentally increased to 20 mg Abilify taking 2 of the 10 mg tablets.  Feels it if forgets the med.  No SE.  Sleepy if don't take med. Lost 30#.   Plan: Reduce Abilify from 20 to 15 mg  daily was markedly helpful for depression and anxiety. Continue Wellbutrin XL 450 mg every morning Continue Adderall 30 mg twice daily or split 2 tablets up to TID..  02/09/2021 appointment with the following noted: Doing well.  Struggling with weight and hard to do anything about it. Knee pain from weight. Patient reports stable mood and mild depressed or irritable moods.  Patient denies any recent difficulty with anxiety.  Patient denies difficulty with sleep initiation or maintenance. Denies appetite disturbance.  Patient reports that energy and motivation have been good.  Patient denies any difficulty with concentration.  Patient denies any suicidal ideation. Benefit from Adderall. More consistent by use of pill box. Satisfied with meds. Plan: no med changes Abilify 15 mg daily was markedly helpful for depression and anxiety. Continue Wellbutrin XL 450 mg every morning Continue Adderall 30 mg twice daily or split 2 tablets up to TID..  08/24/21 appt noted: Can't get Ozempic or Wegovy.  Started exercise 5 days per week which has helped her feel better.  Not depressed.  Feels more alert. No job until August and it would be 2 years until then. Wt stable but not going down.  Had lost 30# while on Saxenda.   Sometimes forgets afternoon Adderall but needs it. Doing well with it. Disc the shortage. Patient reports stable mood and denies depressed or irritable moods.  Patient denies any recent difficulty with anxiety.  Patient denies difficulty with sleep initiation or maintenance. Denies appetite disturbance.  Patient reports that energy and motivation have been good.  Patient denies any difficulty with concentration.  Patient denies any suicidal ideation. No SE Plan: No med changes. Abilify 15 mg daily was markedly helpful for depression and anxiety. Continue Wellbutrin XL 450 mg every morning Continue Adderall 30 mg twice daily or split 2 tablets up to TID.Marland Kitchen  04/18/22 appt noted: Doing  good.  Wonders about chronic breathholding and anxiety.  Happens when gets nervous.  Does not feel SOB but it's more the sound she makes when in meetings.   Routinely anxious in meetings.  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 Plan no changes  10/18/22 appt noted: Continues meds.  Propranolol prn helps anxiety without SE Doing good.  Adderall works well for her.   Patient reports stable mood and denies depressed or irritable moods.  Patient denies any recent difficulty with anxiety.  Patient denies difficulty with sleep initiation or maintenance. Require a lot of sleep.  Change in the last couple of years.Denies appetite disturbance.  Patient reports that energy and motivation have been good.  Patient denies any difficulty with concentration.  Patient denies any suicidal ideation. Anxiety is ok except some social performance.   RTW and has to get up earlier.  Job is a little new.  Stressful sales job.    04/19/23 appt noted: Still doing good.  Interviewing  for new job.  Hard time finding job.  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   Meds still working well.  No panic and no dep.  No SE. Benefit of Adderall 30 BID with consistency for ADD.  Both D's on Adderall.    A little anxiety re: work.  Otherwise anxiety is manageable.  Past Psychiatric Medication Trials: Sertraline, Pristiq, venlafaxine, Wellbutrin 450, paroxetine, Viibryd helped too expensive,  Abilify 15 helped,   Adderall 60 , Vyvanse didn't like topiramate  She has been under our care since 2000  Review of Systems:  Review of Systems  Constitutional:  Negative for fatigue.  Neurological:  Negative for tremors.    Medications: I have reviewed the patient's current medications.  Current Outpatient Medications  Medication Sig Dispense Refill   amphetamine-dextroamphetamine (ADDERALL) 30 MG tablet Take 1 tablet by mouth 2 (two) times daily. 60 tablet 0   celecoxib (CELEBREX) 200 MG capsule Take 200 mg by  mouth daily.     fluticasone (FLONASE) 50 MCG/ACT nasal spray 2 sprays daily as needed.     hydrocortisone (ANUSOL-HC) 2.5 % rectal cream Apply inside and outside of rectum at bedtime x 10 days 30 g 1   hydrocortisone 2.5 % ointment hydrocortisone 2.5 % topical ointment  1 APPLICATION TO AFFECTED AREA TWICE A DAY EXTERNALLY 14 DAYS     Ibuprofen-Famotidine 800-26.6 MG TABS      metFORMIN (GLUCOPHAGE) 500 MG tablet Take by mouth daily with breakfast.     propranolol (INDERAL) 20 MG tablet 1 or 2 tablets as needed daily for performance anxiety 60 tablet 1   amphetamine-dextroamphetamine (ADDERALL) 30 MG tablet Take 1 tablet by mouth 2 (two) times daily. 60 tablet 0   [START ON 05/17/2023] amphetamine-dextroamphetamine (ADDERALL) 30 MG tablet Take 1 tablet by mouth 2 (two) times daily. 60 tablet 0   [START ON 06/14/2023] amphetamine-dextroamphetamine (ADDERALL) 30 MG tablet Take 1 tablet by mouth 2 (two) times daily. 60 tablet 0   ARIPiprazole (ABILIFY) 15 MG tablet Take 1 tablet (15 mg total) by mouth daily. 90 tablet 2   buPROPion (WELLBUTRIN XL) 150 MG 24 hr tablet Take 3 tablets (450 mg total) by mouth daily. 270 tablet 2   No current facility-administered medications for this visit.    Medication Side Effects: None  Allergies: No Known Allergies  Past Medical History:  Diagnosis Date   ADHD    Depression    Dyslipidemia    Insulin resistance    OA (osteoarthritis)    Obesity     Family History  Problem Relation Age of Onset   Diabetes Mother    Breast cancer Mother    Melanoma Father    Hypertension Father    Hyperlipidemia Father    Diverticulitis Father    Kidney Stones Father    Pancreatitis Father    Other Father        small bowel obstruction   Esophageal cancer Neg Hx    Colon cancer Neg Hx     Social History   Socioeconomic History   Marital status: Divorced    Spouse name: Not on file   Number of children: 2   Years of education: Not on file   Highest  education level: Not on file  Occupational History   Occupation: Realtor  Tobacco Use   Smoking status: Former    Types: Cigarettes   Smokeless tobacco: Never  Vaping Use   Vaping status: Never Used  Substance and Sexual Activity   Alcohol use:  Yes    Comment: occ   Drug use: Never   Sexual activity: Not on file  Other Topics Concern   Not on file  Social History Narrative   Not on file   Social Determinants of Health   Financial Resource Strain: Not on file  Food Insecurity: Not on file  Transportation Needs: Not on file  Physical Activity: Not on file  Stress: Not on file  Social Connections: Not on file  Intimate Partner Violence: Not on file    Past Medical History, Surgical history, Social history, and Family history were reviewed and updated as appropriate.   Please see review of systems for further details on the patient's review from today.   Objective:   Physical Exam:  There were no vitals taken for this visit.  Physical Exam Neurological:     Mental Status: She is alert and oriented to person, place, and time.     Cranial Nerves: No dysarthria.  Psychiatric:        Attention and Perception: Attention normal. She does not perceive auditory hallucinations.        Mood and Affect: Mood is not anxious or depressed. Affect is not labile.        Speech: Speech normal. Speech is not slurred.        Behavior: Behavior is cooperative.        Thought Content: Thought content normal. Thought content is not paranoid or delusional. Thought content does not include homicidal or suicidal ideation. Thought content does not include suicidal plan.        Cognition and Memory: Cognition and memory normal.        Judgment: Judgment normal.     Comments: Insight good.      Lab Review:  No results found for: "NA", "K", "CL", "CO2", "GLUCOSE", "BUN", "CREATININE", "CALCIUM", "PROT", "ALBUMIN", "AST", "ALT", "ALKPHOS", "BILITOT", "GFRNONAA", "GFRAA"  No results found  for: "WBC", "RBC", "HGB", "HCT", "PLT", "MCV", "MCH", "MCHC", "RDW", "LYMPHSABS", "MONOABS", "EOSABS", "BASOSABS"  No results found for: "POCLITH", "LITHIUM"   No results found for: "PHENYTOIN", "PHENOBARB", "VALPROATE", "CBMZ"   .res Assessment: Plan:    Generalized anxiety disorder - Plan: ARIPiprazole (ABILIFY) 15 MG tablet  Recurrent major depression in partial remission (HCC)  Attention deficit hyperactivity disorder (ADHD), combined type - Plan: amphetamine-dextroamphetamine (ADDERALL) 30 MG tablet, amphetamine-dextroamphetamine (ADDERALL) 30 MG tablet, amphetamine-dextroamphetamine (ADDERALL) 30 MG tablet  Binge eating disorder - Plan: amphetamine-dextroamphetamine (ADDERALL) 30 MG tablet, amphetamine-dextroamphetamine (ADDERALL) 30 MG tablet, amphetamine-dextroamphetamine (ADDERALL) 30 MG tablet, buPROPion (WELLBUTRIN XL) 150 MG 24 hr tablet   At 11/06/2019 appointment she was struggling with moderate depression and anxiety but also had recent panic attacks.   We discussed various options and decided to potentiate Wellbutrin with the Abilify which has been very successful and she is tolerated it well.  Her anxiety and depression are under control now without recent panic but this required a further increase in Abilify June 2021 to 10 mg daily.  Since the fall 2021 she accidentally increased Abilify from 10 to 20 mg without side effects and feels even less anxious.  She has had no side effects.  Dropped dose of Abilify back to 15 mg daily early 2022 with no loss of benefit. Option switch to Vraylar  She is at the usual maximum dosage of Adderall 60 mg daily.  .  We mentioned and discussed the option of long-acting stimulant such as Mydayis to cover greater part of the day in a more smooth manner  but it is too expensive her for her to do that.    Discussed potential benefits, risks, and side effects of stimulants with patient to include increased heart rate, palpitations, insomnia,  increased anxiety, increased irritability, or decreased appetite.  Instructed patient to contact office if experiencing any significant tolerability issues.  Disc difficulty getting GLP1 meds DT insurance not paying.    Discussed potential metabolic side effects associated with atypical antipsychotics, as well as potential risk for movement side effects. Advised pt to contact office if movement side effects occur.  Would consider reduction Abilify trial but stress is too high with job. No med changes.   Abilify 15 mg daily was markedly helpful for depression and anxiety. Continue Wellbutrin XL 450 mg every morning Continue Adderall 30 mg twice daily or split 2 tablets up to TID.Marland Kitchen  Propranolol prn performance anxiety in classes and meetings 20-40 mg prn  Follow-up 6-9 months.  Call prn.    Meredith Staggers, MD, DFAPA   Please see After Visit Summary for patient specific instructions.  Future Appointments  Date Time Provider Department Center  05/24/2023  9:00 AM Hilarie Fredrickson, MD [facility]      No orders of the defined types were placed in this encounter.      -------------------------------

## 2023-05-24 ENCOUNTER — Encounter: Payer: Self-pay | Admitting: Internal Medicine

## 2023-05-24 ENCOUNTER — Ambulatory Visit: Payer: Medicare Other | Admitting: Internal Medicine

## 2023-05-24 VITALS — BP 116/72 | HR 62 | Temp 98.2°F | Resp 20 | Ht 63.0 in | Wt 227.0 lb

## 2023-05-24 DIAGNOSIS — Z1211 Encounter for screening for malignant neoplasm of colon: Secondary | ICD-10-CM

## 2023-05-24 DIAGNOSIS — K5730 Diverticulosis of large intestine without perforation or abscess without bleeding: Secondary | ICD-10-CM | POA: Diagnosis not present

## 2023-05-24 DIAGNOSIS — Z860100 Personal history of colon polyps, unspecified: Secondary | ICD-10-CM

## 2023-05-24 MED ORDER — SODIUM CHLORIDE 0.9 % IV SOLN: 500.0000 mL | Freq: Once | INTRAVENOUS | Status: DC

## 2023-05-24 NOTE — Progress Notes (Signed)
Expand All Collapse All   ASSESSMENT & PLAN    67 y.o. yo female, new to the practice.  She has a history of colon polyps, anxiety, ADHD, obesity, insulin resistance. She was previously followed by Dr. Kinnie Scales  She requested to transfer to our practice per phone call 03/20/2023.  Dr. Marina Goodell reviewed her previous colonoscopy report   History of adenomatous colon polyps .  Had a TVA ~ 2020. On follow up colonoscopy in 2015 and 8 mm polyps was removed. No pathology identified.  --She is already scheduled for a surveillance colonoscopy with Dr Marina Goodell in December. The risks and benefits of colonoscopy with possible polypectomy / biopsies were discussed and the patient agrees to proceed.    Thrombosed external hemorrhoid.  She came in today concerned that it was a polyp --Anusol cream per rectum and perianally Q HS x 10 days --Reports normal BM but discussed started a fiber supplement and she is agreeable.    See PMH for any additional medical history    HPI    Chief complaint : ? Anal polyp   Erwin has a history of colon polyps, previously followed by Dr. Kinnie Scales.  She is now scheduled for surveillance colonoscopy with Dr. Marina Goodell in December.  She made this appointment out of concern for a polyp protruding from rectum.  She reports normal bowel movements.  No rectal bleeding.  She does have some rectal pressure with bowel movements. No family history of colon cancer.  She is nervous be because she is overdue for her surveillance colonoscopy. She feels fine      Previous GI Studies    **May not be a complete list of studies   Colonoscopy. Dr. Kinnie Scales. 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 This 2015 examination revealed a 8 mm rectosigmoid polyp which was removed (no pathology identified) and a chronic posterior anal fissure. Repeat colonoscopy in 5 years was recommended.          Past Medical History:  Diagnosis Date    ADHD     Depression     Dyslipidemia     Insulin resistance     OA (osteoarthritis)     Obesity          No past surgical history on file.          Family History  Problem Relation Age of Onset   Breast cancer Neg Hx          Social History  Social History        Tobacco Use   Smoking status: Former   Smokeless tobacco: Never            Current Outpatient Medications  Medication Sig Dispense Refill   amphetamine-dextroamphetamine (ADDERALL) 30 MG tablet Take 1 tablet by mouth 2 (two) times daily. 60 tablet 0   amphetamine-dextroamphetamine (ADDERALL) 30 MG tablet Take 1 tablet by mouth 2 (two) times daily. 60 tablet 0   amphetamine-dextroamphetamine (ADDERALL) 30 MG tablet Take 1 tablet by mouth 2 (two) times daily. 60 tablet 0   amphetamine-dextroamphetamine (ADDERALL) 30 MG tablet Take 1 tablet by mouth 2 (two) times daily. 60 tablet 0   ARIPiprazole (ABILIFY) 15 MG tablet Take 1 tablet (15 mg total) by mouth daily. 90 tablet 0   buPROPion (WELLBUTRIN XL) 150 MG 24 hr tablet Take 3 tablets (450 mg total) by mouth daily. 270 tablet 0   celecoxib (CELEBREX) 200 MG capsule  Take 200 mg by mouth daily.       fluticasone (FLONASE) 50 MCG/ACT nasal spray fluticasone propionate 50 mcg/actuation nasal spray,suspension  SPRAY 2 SPRAYS INTO EACH NOSTRIL EVERY DAY       hydrocortisone 2.5 % ointment hydrocortisone 2.5 % topical ointment  1 APPLICATION TO AFFECTED AREA TWICE A DAY EXTERNALLY 14 DAYS       Ibuprofen-Famotidine 800-26.6 MG TABS         metFORMIN (GLUCOPHAGE) 500 MG tablet Take by mouth daily with breakfast.       propranolol (INDERAL) 20 MG tablet 1 or 2 tablets as needed daily for performance anxiety 60 tablet 1      No current facility-administered medications for this visit.      Allergies  No Known Allergies       Review of Systems: All systems reviewed and negative except where noted in HPI.    Wt Readings from Last 3 Encounters:  No data found  for Wt      Physical Exam:  BP 124/70   Pulse 71   Ht 5\' 3"  (1.6 m)   Wt 227 lb (103 kg)   BMI 40.21 kg/m  Constitutional:  Pleasant, generally well appearing female in no acute distress. Psychiatric:  Normal mood and affect. Behavior is normal. EENT: Pupils normal.  Conjunctivae are normal. No scleral icterus. Neck supple.  Cardiovascular: Normal rate, regular rhythm.  Pulmonary/chest: Effort normal and breath sounds normal. No wheezing, rales or rhonchi. Rectal: flesh colored thrombosed external hemorrhoid on left side Abdominal: Soft, nondistended, nontender. Bowel sounds active throughout. There are no masses palpable. No hepatomegaly. Neurological: Alert and oriented to person place and time.     Willette Cluster, NP  03/29/2023, 8:18 AM

## 2023-05-24 NOTE — Progress Notes (Signed)
Pt's states no medical or surgical changes since previsit or office visit. 

## 2023-05-24 NOTE — Op Note (Signed)
[facility] Patient Name: Alexa Perez Procedure Date: 05/24/2023 9:58 AM MRN: [id number] Endoscopist: Wilhemina Bonito. Marina Goodell , MD, [id number] Age: 67 Referring MD:  Date of Birth: 12-18-1955 Gender: Female Account #: [id number] Procedure:                Colonoscopy Indications:              High risk colon cancer surveillance: Personal                            history of adenoma (10 mm or greater in size), High                            risk colon cancer surveillance: Personal history of                            adenoma with villous component. Previous                            colonoscopies with Dr. Kinnie Scales. Around 2010 with                            large lateral spreading tumor in the rectum. Most                            recent examination 2015 with 8 mm polyp. Now for                            surveillance colonoscopy Medicines:                Monitored Anesthesia Care Procedure:                Pre-Anesthesia Assessment:                           - Prior to the procedure, a History and Physical                            was performed, and patient medications and                            allergies were reviewed. The patient's tolerance of                            previous anesthesia was also reviewed. The risks                            and benefits of the procedure and the sedation                            options and risks were discussed with the patient.                            All questions were answered, and informed consent  was obtained. Prior Anticoagulants: The patient has                            taken no anticoagulant or antiplatelet agents. ASA                            Grade Assessment: II - A patient with mild systemic                            disease. After reviewing the risks and benefits,                            the patient was deemed in satisfactory condition to                            undergo the  procedure.                           After obtaining informed consent, the colonoscope                            was passed under direct vision. Throughout the                            procedure, the patient's blood pressure, pulse, and                            oxygen saturations were monitored continuously. The                            CF [id number] #[id number] was introduced through the anus                            and advanced to the the cecum, identified by                            appendiceal orifice and ileocecal valve. The                            ileocecal valve, appendiceal orifice, and rectum                            were photographed. The quality of the bowel                            preparation was excellent. The colonoscopy was                            performed without difficulty. The patient tolerated                            the procedure well. The bowel preparation used was  SUPREP via split dose instruction. Scope In: 10:12:12 AM Scope Out: 10:26:50 AM Scope Withdrawal Time: 0 hours 9 minutes 59 seconds  Total Procedure Duration: 0 hours 14 minutes 38 seconds  Findings:                 Multiple diverticula were found in the sigmoid                            colon. There were mild inflammatory changes                            associated with a segment of diverticulosis                            consistent with SCAD.                           The exam was otherwise without abnormality on                            direct and retroflexion views. No neoplasia. Complications:            No immediate complications. Estimated blood loss:                            None. Estimated Blood Loss:     Estimated blood loss: none. Impression:               - Diverticulosis in the sigmoid colon.                           - The examination was otherwise normal on direct                            and retroflexion views.                            - No specimens collected. Recommendation:           - Repeat colonoscopy in 5 years for surveillance                            (personal history of advanced adenomatous polyp)..                           - Patient has a contact number available for                            emergencies. The signs and symptoms of potential                            delayed complications were discussed with the                            patient. Return to normal activities tomorrow.  Written discharge instructions were provided to the                            patient.                           - Resume previous diet.                           - Continue present medications. Wilhemina Bonito. Marina Goodell, MD 05/24/2023 10:33:13 AM This report has been signed electronically.

## 2023-05-24 NOTE — Progress Notes (Signed)
Sedate, gd SR, tolerated procedure well, VSS, report to RN 

## 2023-05-24 NOTE — Patient Instructions (Signed)
**  Handout given on Diverticulosis**  YOU HAD AN ENDOSCOPIC PROCEDURE TODAY AT THE [facility]:   Refer to the procedure report that was given to you for any specific questions about what was found during the examination.  If the procedure report does not answer your questions, please call your gastroenterologist to clarify.  If you requested that your care partner not be given the details of your procedure findings, then the procedure report has been included in a sealed envelope for you to review at your convenience later.  YOU SHOULD EXPECT: Some feelings of bloating in the abdomen. Passage of more gas than usual.  Walking can help get rid of the air that was put into your GI tract during the procedure and reduce the bloating. If you had a lower endoscopy (such as a colonoscopy or flexible sigmoidoscopy) you may notice spotting of blood in your stool or on the toilet paper. If you underwent a bowel prep for your procedure, you may not have a normal bowel movement for a few days.  Please Note:  You might notice some irritation and congestion in your nose or some drainage.  This is from the oxygen used during your procedure.  There is no need for concern and it should clear up in a day or so.  SYMPTOMS TO REPORT IMMEDIATELY:  Following lower endoscopy (colonoscopy or flexible sigmoidoscopy):  Excessive amounts of blood in the stool  Significant tenderness or worsening of abdominal pains  Swelling of the abdomen that is new, acute  Fever of 100F or higher  For urgent or emergent issues, a gastroenterologist can be reached at any hour by calling [phone]. Do not use MyChart messaging for urgent concerns.    DIET:  We do recommend a small meal at first, but then you may proceed to your regular diet.  Drink plenty of fluids but you should avoid alcoholic beverages for 24 hours.  ACTIVITY:  You should plan to take it easy for the rest of today and you should NOT DRIVE or use  heavy machinery until tomorrow (because of the sedation medicines used during the test).    FOLLOW UP: Our staff will call the number listed on your records the next business day following your procedure.  We will call around 7:15- 8:00 am to check on you and address any questions or concerns that you may have regarding the information given to you following your procedure. If we do not reach you, we will leave a message.     If any biopsies were taken you will be contacted by phone or by letter within the next 1-3 weeks.  Please call us at [phone] if you have not heard about the biopsies in 3 weeks.    SIGNATURES/CONFIDENTIALITY: You and/or your care partner have signed paperwork which will be entered into your electronic medical record.  These signatures attest to the fact that that the information above on your After Visit Summary has been reviewed and is understood.  Full responsibility of the confidentiality of this discharge information lies with you and/or your care-partner.

## 2023-05-24 NOTE — Progress Notes (Deleted)
HISTORY OF PRESENT ILLNESS:  Alexa Perez is a 67 y.o. female sent for screening colonoscopy.  No complaints  REVIEW OF SYSTEMS:  All non-GI ROS negative except for  Past Medical History:  Diagnosis Date   ADHD    Depression    Dyslipidemia    Insulin resistance    OA (osteoarthritis)    Obesity     Past Surgical History:  Procedure Laterality Date   CESAREAN SECTION     x 1   COLONOSCOPY     KNEE ARTHROSCOPY Right     Social History OLAMAE DIEBEL  reports that she has quit smoking. Her smoking use included cigarettes. She has never used smokeless tobacco. She reports current alcohol use. She reports that she does not use drugs.  family history includes Breast cancer in her mother; Diabetes in her mother; Diverticulitis in her father; Hyperlipidemia in her father; Hypertension in her father; Kidney Stones in her father; Melanoma in her father; Other in her father; Pancreatitis in her father.  No Known Allergies     PHYSICAL EXAMINATION: Vital signs: BP (!) 146/78   Pulse 86   Temp 98.2 F (36.8 C) (Temporal)   Ht 5\' 3"  (1.6 m)   Wt 227 lb (103 kg)   SpO2 94%   BMI 40.21 kg/m  General: Well-developed, well-nourished, no acute distress HEENT: Sclerae are anicteric, conjunctiva pink. Oral mucosa intact Lungs: Clear Heart: Regular Abdomen: soft, nontender, nondistended, no obvious ascites, no peritoneal signs, normal bowel sounds. No organomegaly. Extremities: No edema Psychiatric: alert and oriented x3. Cooperative     ASSESSMENT:   Colon cancer screening  PLAN:  Screening colonoscopy

## 2023-05-25 ENCOUNTER — Telehealth: Payer: Self-pay

## 2023-05-25 NOTE — Telephone Encounter (Signed)
  Follow up Call-     05/24/2023    8:47 AM  Call back number  Post procedure Call Back phone  # [phone]  Permission to leave phone message Yes     Patient questions:  Do you have a fever, pain , or abdominal swelling? No. Pain Score  0 *  Have you tolerated food without any problems? Yes.    Have you been able to return to your normal activities? Yes.    Do you have any questions about your discharge instructions: Diet   No. Medications  No. Follow up visit  No.  Do you have questions or concerns about your Care? No.  Actions: * If pain score is 4 or above: No action needed, pain <4.

## 2023-07-09 ENCOUNTER — Telehealth: Payer: Self-pay | Admitting: Psychiatry

## 2023-07-09 ENCOUNTER — Other Ambulatory Visit: Payer: Self-pay

## 2023-07-09 DIAGNOSIS — F50819 Binge eating disorder, unspecified: Secondary | ICD-10-CM

## 2023-07-09 DIAGNOSIS — F902 Attention-deficit hyperactivity disorder, combined type: Secondary | ICD-10-CM

## 2023-07-09 MED ORDER — AMPHETAMINE-DEXTROAMPHETAMINE 30 MG PO TABS
30.0000 mg | ORAL_TABLET | Freq: Two times a day (BID) | ORAL | 0 refills | Status: DC
Start: 2023-08-06 — End: 2023-10-17

## 2023-07-09 MED ORDER — AMPHETAMINE-DEXTROAMPHETAMINE 30 MG PO TABS: 30.0000 mg | ORAL_TABLET | Freq: Two times a day (BID) | ORAL | 0 refills | Status: DC

## 2023-07-09 MED ORDER — AMPHETAMINE-DEXTROAMPHETAMINE 30 MG PO TABS
30.0000 mg | ORAL_TABLET | Freq: Two times a day (BID) | ORAL | 0 refills | Status: DC
Start: 2023-07-09 — End: 2024-01-17

## 2023-07-09 NOTE — Telephone Encounter (Signed)
PENDED ADDERALL 30 MG TO [facility]

## 2023-07-09 NOTE — Telephone Encounter (Signed)
Pt lvm that she needs a refill on her adderall 30 mg. Pharmacy is [facility] on [address] [employer]

## 2023-10-17 ENCOUNTER — Telehealth: Payer: Self-pay | Admitting: Psychiatry

## 2023-10-17 ENCOUNTER — Other Ambulatory Visit: Payer: Self-pay

## 2023-10-17 DIAGNOSIS — F902 Attention-deficit hyperactivity disorder, combined type: Secondary | ICD-10-CM

## 2023-10-17 DIAGNOSIS — F50819 Binge eating disorder, unspecified: Secondary | ICD-10-CM

## 2023-10-17 MED ORDER — AMPHETAMINE-DEXTROAMPHETAMINE 30 MG PO TABS: 30.0000 mg | ORAL_TABLET | Freq: Two times a day (BID) | ORAL | 0 refills | Status: DC

## 2023-10-17 MED ORDER — AMPHETAMINE-DEXTROAMPHETAMINE 30 MG PO TABS
30.0000 mg | ORAL_TABLET | Freq: Two times a day (BID) | ORAL | 0 refills | Status: DC
Start: 2023-12-12 — End: 2024-01-17

## 2023-10-17 NOTE — Telephone Encounter (Signed)
 Pt called 9:42 am requesting Rx for Adderall 30 mg, Wellbutrin  150 mg and Abilify  15 mg to [address]

## 2023-10-17 NOTE — Telephone Encounter (Signed)
 Pended 3 RF for Adderall to [facility] on [address]. She has RF available for Abilify  and Wellbutrin . Notified her.

## 2024-01-17 ENCOUNTER — Ambulatory Visit (INDEPENDENT_AMBULATORY_CARE_PROVIDER_SITE_OTHER): Payer: Self-pay | Admitting: Psychiatry

## 2024-01-17 ENCOUNTER — Encounter: Payer: Self-pay | Admitting: Psychiatry

## 2024-01-17 DIAGNOSIS — F418 Other specified anxiety disorders: Secondary | ICD-10-CM

## 2024-01-17 DIAGNOSIS — F902 Attention-deficit hyperactivity disorder, combined type: Secondary | ICD-10-CM | POA: Diagnosis not present

## 2024-01-17 DIAGNOSIS — F3341 Major depressive disorder, recurrent, in partial remission: Secondary | ICD-10-CM | POA: Diagnosis not present

## 2024-01-17 DIAGNOSIS — F50819 Binge eating disorder, unspecified: Secondary | ICD-10-CM

## 2024-01-17 DIAGNOSIS — F411 Generalized anxiety disorder: Secondary | ICD-10-CM

## 2024-01-17 MED ORDER — BUPROPION HCL ER (XL) 150 MG PO TB24: 450.0000 mg | ORAL_TABLET | Freq: Every day | ORAL | 2 refills | Status: AC

## 2024-01-17 MED ORDER — AMPHETAMINE-DEXTROAMPHETAMINE 30 MG PO TABS: 30.0000 mg | ORAL_TABLET | Freq: Two times a day (BID) | ORAL | 0 refills | Status: AC

## 2024-01-17 MED ORDER — ARIPIPRAZOLE 15 MG PO TABS: 15.0000 mg | ORAL_TABLET | Freq: Every day | ORAL | 2 refills | Status: AC

## 2024-01-17 MED ORDER — AMPHETAMINE-DEXTROAMPHETAMINE 30 MG PO TABS: 30.0000 mg | ORAL_TABLET | Freq: Two times a day (BID) | ORAL | 0 refills | Status: DC

## 2024-01-17 NOTE — Progress Notes (Signed)
 Alexa Perez [id number] 1956/04/20 68 y.o.    Subjective:   Patient ID:  Alexa Perez is a 68 y.o. (DOB [birth year]) female.  Chief Complaint:  Chief Complaint  Patient presents with   Follow-up    [name] presents to the office today for follow-up of ADHD, major depression, generalized anxiety disorder, and binge eating disorder.  seen April 2020.  We elected to retry the buspirone  at low dose 5 mg twice a day for 1 week and then 10 mg twice a day for anxiety. She continued on Wellbutrin  XL 450.  She had previously stopped Viibryd due to cost.  seen October 2020.  She had residual depression and it was suggested she return to Abilify  which is previously been helpful.  She was to follow-up in 3 months but it has been about 6 months.  seen 09/12/19 with the followining noted: Had massive anxiety with Panic with Covid early Feb and felt she couldn't breathe.Had not taken anything during Covid and slept a lot.  Restarted antidepressant after Covid.  Missing some.  Still very lethargic and SOB related to Covid.  Intermittent loss of taste. Never took Abilify  consistently since here.  Was taking Wellbutrin  consistently not Buspar  before Covid.  Pre-Covid had still been unmotivated with low energy and productivity.  Adderall would work for that.  Adderall compliance also varies. Enough sleep. Anhedonia not sad. Depression 5/10.  Anxiety  5/10.   Plan: Start Abilify  5 mg 1/2 tablet each morning for 1 week and if no benefit increase to  5 smg daily. Stop buspirone  dT polypharmacy and compliance problems. Be more consistent with Wellbutrin  otherwise will not work.  11/07/2019 appointment the following is noted: Abilify  helped.  Depression started going away.  Anxiety better and handling things better and more assertive.  Doesn't want a change.  Tolerating it well. Sleep plenty.  Awakening on time generally. Adderall first thing in AM and helped to take it in a different  pattern. Adderall is fine generally. Still having ADD sx even with the meds.  Needs it mostly.  She is no longer having elevated heart rate.  She likes the short acting because she can control the time and duration better than with slow release. Plan: No med changes  12/01/2019 phone call from patient with the following noted: She is on Wellbutrin , Adderall, and Abilify  5 mg was last med added which helped her depression.  She has taken buspirone  in the past but had difficulty with compliance because of twice daily dosing. She wants something for anxiety.  We could consider starting an SSRI but historically she has wanted to avoid those.  We want to avoid benzodiazepines because of taking the Adderall.  Therefore the simplest solution would be an increase in Abilify .  Have her increase Abilify  to 1-1/2 of the 5 mg tablets or 7.5 mg daily.  We can go as high as 10 mg if needed.  She should see improvement within a couple of weeks. . 04/27/2020 appointment with the following noted: Adding 5 mg to total of 10 mg Abilify  helped. Changed jobs to help deal with the stress and it's better now after leaving that terrrible job.   Increase Abilify  to 10 mg helped additionally with depression and anxiety. Asks about the increase Adderall to TID bc of longer hours.   Plan: Abilify  10 mg daily was markedly helpful for depression and anxiety. Continue Wellbutrin  XL 450 mg every morning Continue Adderall 30 mg twice daily or split  2 tablets up to TID..  08/18/20 appt noted: Doing well.  Occ forgets meds and feels bad.  Feels great. Not even stressed out as new job with some $ stress.  Handling stress much better.   Accidentally increased to 20 mg Abilify  taking 2 of the 10 mg tablets.  Feels it if forgets the med.  No SE.  Sleepy if don't take med. Lost 30#.   Plan: Reduce Abilify  from 20 to 15 mg daily was markedly helpful for depression and anxiety. Continue Wellbutrin  XL 450 mg every morning Continue Adderall  30 mg twice daily or split 2 tablets up to TID..  02/09/2021 appointment with the following noted: Doing well.  Struggling with weight and hard to do anything about it. Knee pain from weight. Patient reports stable mood and mild depressed or irritable moods.  Patient denies any recent difficulty with anxiety.  Patient denies difficulty with sleep initiation or maintenance. Denies appetite disturbance.  Patient reports that energy and motivation have been good.  Patient denies any difficulty with concentration.  Patient denies any suicidal ideation. Benefit from Adderall. More consistent by use of pill box. Satisfied with meds. Plan: no med changes Abilify  15 mg daily was markedly helpful for depression and anxiety. Continue Wellbutrin  XL 450 mg every morning Continue Adderall 30 mg twice daily or split 2 tablets up to TID..  08/24/21 appt noted: Can't get Ozempic or Wegovy.  Started exercise 5 days per week which has helped her feel better.  Not depressed.  Feels more alert. No job until August and it would be 2 years until then. Wt stable but not going down.  Had lost 30# while on Saxenda.   Sometimes forgets afternoon Adderall but needs it. Doing well with it. Disc the shortage. Patient reports stable mood and denies depressed or irritable moods.  Patient denies any recent difficulty with anxiety.  Patient denies difficulty with sleep initiation or maintenance. Denies appetite disturbance.  Patient reports that energy and motivation have been good.  Patient denies any difficulty with concentration.  Patient denies any suicidal ideation. No SE Plan: No med changes. Abilify  15 mg daily was markedly helpful for depression and anxiety. Continue Wellbutrin  XL 450 mg every morning Continue Adderall 30 mg twice daily or split 2 tablets up to TID.SABRA  04/18/22 appt noted: Doing good.  Wonders about chronic breathholding and anxiety.  Happens when gets nervous.  Does not feel SOB but it's more the  sound she makes when in meetings.   Routinely anxious in meetings.  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 Plan no changes  10/18/22 appt noted: Continues meds.  Propranolol  prn helps anxiety without SE Doing good.  Adderall works well for her.   Patient reports stable mood and denies depressed or irritable moods.  Patient denies any recent difficulty with anxiety.  Patient denies difficulty with sleep initiation or maintenance. Require a lot of sleep.  Change in the last couple of years.Denies appetite disturbance.  Patient reports that energy and motivation have been good.  Patient denies any difficulty with concentration.  Patient denies any suicidal ideation. Anxiety is ok except some social performance.   RTW and has to get up earlier.  Job is a little new.  Stressful sales job.    04/19/23 appt noted: Still doing good.  Interviewing for new job.  Hard time finding job.  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   Meds still working well.  No panic and no dep.  No  SE. Benefit of Adderall 30 BID with consistency for ADD.  01/17/24 appt noted:  in person Med: Adderall 30 BID, Abilify  15, Wellbutrin  XL 450.  Propranolol  20 mg prn . Latter helps anxiety.   SE none. No dep.  Anxiety intermittent.  Really happy all the time.   Sleep good generally.   No changes desired.   Still selling houses, now with [facility].  New diet [place] WT Loss.  Health stable.  Both D's on Adderall.    A little anxiety re: work.  Otherwise anxiety is manageable.  Past Psychiatric Medication Trials: Sertraline, Pristiq, venlafaxine, Wellbutrin  450, paroxetine, Viibryd helped too expensive,  Abilify  15 helped,   Adderall 60 , Vyvanse didn't like topiramate  She has been under our care since 2000  Review of Systems:  Review of Systems  Constitutional:  Negative for fatigue.  Neurological:  Negative for tremors.    Medications: I have reviewed the patient's current  medications.  Current Outpatient Medications  Medication Sig Dispense Refill   acetaminophen (TYLENOL) 325 MG tablet Take 325 mg by mouth every 6 (six) hours as needed for moderate pain (pain score 4-6). As needed for knee pain, Takes 2 pills PRN per patient     amphetamine -dextroamphetamine  (ADDERALL) 30 MG tablet Take 1 tablet by mouth 2 (two) times daily. 60 tablet 0   celecoxib (CELEBREX) 200 MG capsule Take 200 mg by mouth daily.     fluticasone (FLONASE) 50 MCG/ACT nasal spray 2 sprays daily as needed.     hydrocortisone  (ANUSOL -HC) 2.5 % rectal cream Apply inside and outside of rectum at bedtime x 10 days 30 g 1   hydrocortisone  2.5 % ointment hydrocortisone  2.5 % topical ointment  1 APPLICATION TO AFFECTED AREA TWICE A DAY EXTERNALLY 14 DAYS     Ibuprofen-Famotidine 800-26.6 MG TABS      metFORMIN (GLUCOPHAGE) 500 MG tablet Take by mouth daily with breakfast.     propranolol  (INDERAL ) 20 MG tablet 1 or 2 tablets as needed daily for performance anxiety 60 tablet 1   amphetamine -dextroamphetamine  (ADDERALL) 30 MG tablet Take 1 tablet by mouth 2 (two) times daily. 60 tablet 0   [START ON 02/14/2024] amphetamine -dextroamphetamine  (ADDERALL) 30 MG tablet Take 1 tablet by mouth 2 (two) times daily. 60 tablet 0   [START ON 03/13/2024] amphetamine -dextroamphetamine  (ADDERALL) 30 MG tablet Take 1 tablet by mouth 2 (two) times daily. 60 tablet 0   ARIPiprazole  (ABILIFY ) 15 MG tablet Take 1 tablet (15 mg total) by mouth daily. 90 tablet 2   buPROPion  (WELLBUTRIN  XL) 150 MG 24 hr tablet Take 3 tablets (450 mg total) by mouth daily. 270 tablet 2   No current facility-administered medications for this visit.    Medication Side Effects: None  Allergies: No Known Allergies  Past Medical History:  Diagnosis Date   ADHD    Depression    Dyslipidemia    Insulin  resistance    OA (osteoarthritis)    Obesity     Family History  Problem Relation Age of Onset   Diabetes Mother    Breast cancer  Mother    Melanoma Father    Hypertension Father    Hyperlipidemia Father    Diverticulitis Father    Kidney Stones Father    Pancreatitis Father    Other Father        small bowel obstruction   Esophageal cancer Neg Hx    Colon cancer Neg Hx    Stomach cancer Neg Hx  Rectal cancer Neg Hx     Social History   Socioeconomic History   Marital status: Divorced    Spouse name: Not on file   Number of children: 2   Years of education: Not on file   Highest education level: Not on file  Occupational History   Occupation: Realtor  Tobacco Use   Smoking status: Former    Types: Cigarettes   Smokeless tobacco: Never  Vaping Use   Vaping status: Never Used  Substance and Sexual Activity   Alcohol use: Yes    Comment: occ   Drug use: Never   Sexual activity: Not on file  Other Topics Concern   Not on file  Social History Narrative   Not on file   Social Drivers of Health   Financial Resource Strain: Not on file  Food Insecurity: Not on file  Transportation Needs: Not on file  Physical Activity: Not on file  Stress: Not on file  Social Connections: Not on file  Intimate Partner Violence: Not on file    Past Medical History, Surgical history, Social history, and Family history were reviewed and updated as appropriate.   Please see review of systems for further details on the patient's review from today.   Objective:   Physical Exam:  There were no vitals taken for this visit.  Physical Exam Neurological:     Mental Status: She is alert and oriented to person, place, and time.     Cranial Nerves: No dysarthria.  Psychiatric:        Attention and Perception: Attention normal. She does not perceive auditory hallucinations.        Mood and Affect: Mood is not anxious or depressed. Affect is not labile.        Speech: Speech normal. Speech is not slurred.        Behavior: Behavior is cooperative.        Thought Content: Thought content normal. Thought content is  not paranoid or delusional. Thought content does not include homicidal or suicidal ideation. Thought content does not include suicidal plan.        Cognition and Memory: Cognition and memory normal.        Judgment: Judgment normal.     Comments: Insight good.      Lab Review:  No results found for: NA, K, CL, CO2, GLUCOSE, BUN, CREATININE, CALCIUM, PROT, ALBUMIN, AST, ALT, ALKPHOS, BILITOT, GFRNONAA, GFRAA  No results found for: WBC, RBC, HGB, HCT, PLT, MCV, MCH, MCHC, RDW, LYMPHSABS, MONOABS, EOSABS, BASOSABS  No results found for: POCLITH, LITHIUM   No results found for: PHENYTOIN, PHENOBARB, VALPROATE, CBMZ   .res Assessment: Plan:    Generalized anxiety disorder - Plan: ARIPiprazole  (ABILIFY ) 15 MG tablet  Attention deficit hyperactivity disorder (ADHD), combined type - Plan: amphetamine -dextroamphetamine  (ADDERALL) 30 MG tablet, amphetamine -dextroamphetamine  (ADDERALL) 30 MG tablet, amphetamine -dextroamphetamine  (ADDERALL) 30 MG tablet  Recurrent major depression in partial remission (HCC)  Performance anxiety  Binge eating disorder - Plan: buPROPion  (WELLBUTRIN  XL) 150 MG 24 hr tablet, amphetamine -dextroamphetamine  (ADDERALL) 30 MG tablet, amphetamine -dextroamphetamine  (ADDERALL) 30 MG tablet, amphetamine -dextroamphetamine  (ADDERALL) 30 MG tablet   At 11/06/2019 appointment she was struggling with moderate depression and anxiety but also had recent panic attacks.   We discussed various options and decided to potentiate Wellbutrin  with the Abilify  which has been very successful and she is tolerated it well.  Her anxiety and depression are under control now without recent panic but this required a further increase in Abilify  June  2021 to 10 mg daily.  Since the fall 2021 she accidentally increased Abilify  from 10 to 20 mg without side effects and feels even less anxious.  She has had no side effects.  Dropped dose  of Abilify  back to 15 mg daily early 2022 with no loss of benefit. Option switch to Vraylar  She is at the usual maximum dosage of Adderall 60 mg daily.  .  We mentioned and discussed the option of long-acting stimulant such as Mydayis to cover greater part of the day in a more smooth manner but it is too expensive her for her to do that.    Discussed potential benefits, risks, and side effects of stimulants with patient to include increased heart rate, palpitations, insomnia, increased anxiety, increased irritability, or decreased appetite.  Instructed patient to contact office if experiencing any significant tolerability issues.  Discussed potential metabolic side effects associated with atypical antipsychotics, as well as potential risk for movement side effects. Advised pt to contact office if movement side effects occur.  Would consider reduction Abilify  trial but stress is too high with job.  She doesn't want changes. No med changes.   Abilify  15 mg daily was markedly helpful for depression and anxiety. Continue Wellbutrin  XL 450 mg every morning Continue Adderall 30 mg twice daily or split 2 tablets up to TID..  Propranolol  prn performance anxiety in classes and meetings 20-40 mg prn  Follow-up 6-9 months.  Call prn.    Lorene Macintosh, MD, DFAPA   Please see After Visit Summary for patient specific instructions.  No future appointments.     No orders of the defined types were placed in this encounter.      -------------------------------

## 2024-04-16 ENCOUNTER — Telehealth: Payer: Self-pay | Admitting: Psychiatry

## 2024-04-16 ENCOUNTER — Other Ambulatory Visit: Payer: Self-pay | Admitting: Psychiatry

## 2024-04-16 DIAGNOSIS — F418 Other specified anxiety disorders: Secondary | ICD-10-CM

## 2024-04-16 MED ORDER — PROPRANOLOL HCL 20 MG PO TABS: ORAL_TABLET | ORAL | 1 refills | Status: DC

## 2024-04-16 NOTE — Telephone Encounter (Signed)
 Sent!

## 2024-04-16 NOTE — Telephone Encounter (Signed)
 Pt LVM @ 11:19a requesting refill of Propranolol  to   [id number] - [place], [place] - [address] AT [facility] OF [address] & [address] [place] [place] [address] Phone: [phone]  Fax: [phone]   Next appt 4/30

## 2024-04-25 ENCOUNTER — Other Ambulatory Visit: Payer: Self-pay | Admitting: Psychiatry

## 2024-04-25 DIAGNOSIS — F418 Other specified anxiety disorders: Secondary | ICD-10-CM

## 2024-04-29 ENCOUNTER — Telehealth: Payer: Self-pay | Admitting: Psychiatry

## 2024-04-29 ENCOUNTER — Other Ambulatory Visit: Payer: Self-pay

## 2024-04-29 DIAGNOSIS — F50819 Binge eating disorder, unspecified: Secondary | ICD-10-CM

## 2024-04-29 DIAGNOSIS — F902 Attention-deficit hyperactivity disorder, combined type: Secondary | ICD-10-CM

## 2024-04-29 MED ORDER — AMPHETAMINE-DEXTROAMPHETAMINE 30 MG PO TABS: 30.0000 mg | ORAL_TABLET | Freq: Two times a day (BID) | ORAL | 0 refills | Status: AC

## 2024-04-29 NOTE — Telephone Encounter (Signed)
 Pt lvm requesting Adderall  30 mg Rx to [facility]

## 2024-04-29 NOTE — Telephone Encounter (Signed)
Pended 3 RF.

## 2024-10-16 ENCOUNTER — Ambulatory Visit: Admitting: Psychiatry
